# Patient Record
Sex: Male | Born: 1990 | Hispanic: No | Marital: Single | Smoking: Current every day smoker
Health system: Southern US, Community
[De-identification: ages and names within clinical notes are randomized; demographics above are authoritative.]

## PROBLEM LIST (undated history)

## (undated) DIAGNOSIS — L309 Dermatitis, unspecified: Secondary | ICD-10-CM

## (undated) HISTORY — PX: EYE SURGERY: SHX253

---

## 2011-04-14 ENCOUNTER — Emergency Department: Payer: Self-pay | Admitting: Internal Medicine

## 2013-02-23 ENCOUNTER — Emergency Department: Payer: Self-pay | Admitting: Emergency Medicine

## 2013-04-16 ENCOUNTER — Emergency Department: Payer: Self-pay | Admitting: Emergency Medicine

## 2014-01-17 ENCOUNTER — Emergency Department: Payer: Self-pay | Admitting: Emergency Medicine

## 2014-08-05 ENCOUNTER — Emergency Department: Payer: Self-pay

## 2015-05-14 ENCOUNTER — Emergency Department
Admission: EM | Admit: 2015-05-14 | Discharge: 2015-05-14 | Disposition: A | Discharge status: Home / Self Care | Payer: Self-pay | Attending: Emergency Medicine | Admitting: Emergency Medicine

## 2015-05-14 DIAGNOSIS — F17200 Nicotine dependence, unspecified, uncomplicated: Secondary | ICD-10-CM | POA: Insufficient documentation

## 2015-05-14 DIAGNOSIS — Z79899 Other long term (current) drug therapy: Secondary | ICD-10-CM | POA: Insufficient documentation

## 2015-05-14 DIAGNOSIS — L309 Dermatitis, unspecified: Secondary | ICD-10-CM | POA: Insufficient documentation

## 2015-05-14 HISTORY — DX: Dermatitis, unspecified: L30.9

## 2015-05-14 MED ORDER — CYPROHEPTADINE HCL 4 MG PO TABS: 4.0000 mg | ORAL_TABLET | Freq: Three times a day (TID) | ORAL | Status: AC | PRN

## 2015-05-14 MED ORDER — TRIAMCINOLONE ACETONIDE 0.5 % EX OINT: 1.0000 "application " | TOPICAL_OINTMENT | Freq: Two times a day (BID) | CUTANEOUS | Status: DC

## 2015-05-14 MED ORDER — PREDNISONE 10 MG PO TABS: 10.0000 mg | ORAL_TABLET | Freq: Two times a day (BID) | ORAL | Status: AC

## 2015-05-14 NOTE — ED Provider Notes (Signed)
[facility] Emergency Department Provider Note ____________________________________________  Time seen: 1600  I have reviewed the triage vital signs and the nursing notes.  HISTORY  Chief Complaint  Eczema  HPI Brandon Boyd is a 24 y.o. male reports to the ED for evaluation and treatment of his eczema which is been flaring for the past 3 months. He describes being seen by his dermatologist, but denies any significant improvement in his symptoms, since he completed his last dose of steroid ointment 3 + months earlier. He notes flare of the eczema to his hands, extremities and torso. He notes some open sores due to scratching. He has been using coconut oil and wearing gloves at work. He has also been dosing Zyrtec for daily allergy relief.  Past Medical History  Diagnosis Date  . Eczema    There are no active problems to display for this patient.  History reviewed. No pertinent past surgical history.  Current Outpatient Rx  Name  Route  Sig  Dispense  Refill  . cyproheptadine (PERIACTIN) 4 MG tablet   Oral   Take 1 tablet (4 mg total) by mouth 3 (three) times daily as needed for allergies.   30 tablet   0   . predniSONE (DELTASONE) 10 MG tablet   Oral   Take 1 tablet (10 mg total) by mouth 2 (two) times daily with a meal.   10 tablet   0   . triamcinolone ointment (KENALOG) 0.5 %   Topical   Apply 1 application topically 2 (two) times daily.   30 g   0     Mix 1:1 with Eucerin Cream    Allergies Review of patient's allergies indicates no known allergies.  No family history on file.  Social History Social History  Substance Use Topics  . Smoking status: Current Every Day Smoker  . Smokeless tobacco: None  . Alcohol Use: No   Review of Systems  Constitutional: Negative for fever. Eyes: Negative for visual changes. ENT: Negative for sore throat. Cardiovascular: Negative for chest pain. Respiratory: Negative for shortness of  breath. Gastrointestinal: Negative for abdominal pain, vomiting and diarrhea. Genitourinary: Negative for dysuria. Musculoskeletal: Negative for back pain. Skin: Positive for rash. Neurological: Negative for headaches, focal weakness or numbness. ____________________________________________  PHYSICAL EXAM:  VITAL SIGNS: ED Triage Vitals  Enc Vitals Group     BP 05/14/15 1517 145/81 mmHg     Pulse Rate 05/14/15 1517 101     Resp 05/14/15 1517 18     Temp 05/14/15 1517 98 F (36.7 C)     Temp Source 05/14/15 1517 Oral     SpO2 05/14/15 1517 98 %     Weight 05/14/15 1517 193 lb (87.544 kg)     Height 05/14/15 1517 6\' 1"  (1.854 m)     Head Cir --      Peak Flow --      Pain Score --      Pain Loc --      Pain Edu? --      Excl. in GC? --    Constitutional: Alert and oriented. Well appearing and in no distress. Head: Normocephalic and atraumatic.      Eyes: Conjunctivae are normal. PERRL. Normal extraocular movements      Ears: Canals clear. TMs intact bilaterally.   Nose: No congestion/rhinorrhea.   Mouth/Throat: Mucous membranes are moist.   Neck: Supple. No thyromegaly. Hematological/Lymphatic/Immunological: No cervical lymphadenopathy. Cardiovascular: Normal rate, regular rhythm.  Respiratory: Normal respiratory  effort. No wheezes/rales/rhonchi. Gastrointestinal: Soft and nontender. No distention. Musculoskeletal: Nontender with normal range of motion in all extremities.  Neurologic:  Normal gait without ataxia. Normal speech and language. No gross focal neurologic deficits are appreciated. Skin:  Skin is warm, dry and intact. Patient with multiple maculopapular patches across the hands, extremities, and torso consistent with severe eczema. Some of the lesions show some excoriations and crusting. Patient with hypertrophic palm lesions, nails and cuticles secondary to hand eczema. Patient's scalp with patchy scale consistent with seborrheic dermatitis. Psychiatric:  Mood and affect are normal. Patient exhibits appropriate insight and judgment. ____________________________________________  INITIAL IMPRESSION / ASSESSMENT AND PLAN / ED COURSE  Patient with a chronic history of eczema that is poorly controlled currently. 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 He is discharged with prescription for a 1:1 mixture of triamcinolone 0.5% plus Eucerin. He is also discharged with Periactin to dose daily for antihistamine benefit. He is provided with a short course of prednisone to dose as well. He will follow-up with his oncologist as scheduled next month. He is encouraged to continue to moisturize daily, and avoid excessive heat, hot showers, and other triggers. He should consider seeing an allergy specialist due to his severe allergic dermatitis. ____________________________________________  FINAL CLINICAL IMPRESSION(S) / ED DIAGNOSES  Final diagnoses:  Eczema      JeniLissa Hoard PA-C 05/14/15 1854  Emily Filbert, MD 05/15/15 [phone]

## 2015-05-14 NOTE — Discharge Instructions (Signed)
Hand Dermatitis °Hand dermatitis is a skin problem. Small, itchy, raised dots or blisters appear on the palms of the hands. Hand dermatitis can last 3 to 4 weeks. °HOME CARE °· Avoid washing your hands too much. °· Avoid all harsh chemicals. Wear gloves when you use products that can bother your skin. °· Use medicated cream (1% hydrocortisone cream) at least 2 to 4 times per day. °· Only take medicine as told by your doctor. °· You may use wet cloths (compresses) or cold packs. °GET HELP RIGHT AWAY IF:  °· The rash is not better after 1 week of treatment. °· The area is red, tender, or yellowish-white fluid (pus) comes from the wound. °· The rash is spreading. °MAKE SURE YOU:  °· Understand these instructions. °· Will watch your condition. °· Will get help right away if you are not doing well or get worse. °  °This information is not intended to replace advice given to you by your health care provider. Make sure you discuss any questions you have with your health care provider. °  °Document Released: 07/30/2009 Document Revised: 07/28/2011 Document Reviewed: 11/17/2014 °Elsevier Interactive Patient Education ©2016 Elsevier Inc[employer] ° °

## 2015-05-14 NOTE — ED Notes (Signed)
Pt c/o eczema breaking out for the past 3 months, states he has seen his dermatologist and it keeps getting worse..Brandon Boyd

## 2015-05-21 ENCOUNTER — Emergency Department
Admission: EM | Admit: 2015-05-21 | Discharge: 2015-05-21 | Disposition: A | Discharge status: Home / Self Care | Payer: Self-pay | Attending: Emergency Medicine | Admitting: Emergency Medicine

## 2015-05-21 ENCOUNTER — Encounter: Payer: Self-pay | Admitting: Emergency Medicine

## 2015-05-21 DIAGNOSIS — L309 Dermatitis, unspecified: Secondary | ICD-10-CM | POA: Insufficient documentation

## 2015-05-21 DIAGNOSIS — F17200 Nicotine dependence, unspecified, uncomplicated: Secondary | ICD-10-CM | POA: Insufficient documentation

## 2015-05-21 DIAGNOSIS — Z7952 Long term (current) use of systemic steroids: Secondary | ICD-10-CM | POA: Insufficient documentation

## 2015-05-21 MED ORDER — TRIAMCINOLONE ACETONIDE 0.5 % EX OINT: 1.0000 "application " | TOPICAL_OINTMENT | Freq: Two times a day (BID) | CUTANEOUS | Status: DC

## 2015-05-21 NOTE — ED Notes (Signed)
Pt reports needing refill of cream for his eczema

## 2015-05-21 NOTE — ED Provider Notes (Signed)
[facility] Emergency Department Provider Note ?  ? ____________________________________________ ? Time seen: 5:18 PM ? I have reviewed the triage vital signs and the nursing notes.  ________ HISTORY ? Chief Complaint Medication Refill     HPI  Brandon Boyd is a 25 y.o. male   who presents emergency department complaining of his eczema. Patient states that he was seen in this department and given a topical ointment that was combination of triamcinolone and Eucerin. He states that he has been having good relief with this medication however he states he is currently out of medication. He is seeing his dermatologist and 2 weeks and needs a refill until that time. He endorses eczema to bilateral upper and lower extremities as well as chest wall. ? ? ? Past Medical History  Diagnosis Date  . Eczema     There are no active problems to display for this patient.  ? History reviewed. No pertinent past surgical history. ? Current Outpatient Rx  Name  Route  Sig  Dispense  Refill  . cyproheptadine (PERIACTIN) 4 MG tablet   Oral   Take 1 tablet (4 mg total) by mouth 3 (three) times daily as needed for allergies.   30 tablet   0   . predniSONE (DELTASONE) 10 MG tablet   Oral   Take 1 tablet (10 mg total) by mouth 2 (two) times daily with a meal.   10 tablet   0   . triamcinolone ointment (KENALOG) 0.5 %   Topical   Apply 1 application topically 2 (two) times daily.   60 g   2     Mix 1:1 with Eucerin Cream    ? Allergies Review of patient's allergies indicates no known allergies. ? No family history on file. ? Social History Social History  Substance Use Topics  . Smoking status: Current Every Day Smoker  . Smokeless tobacco: None  . Alcohol Use: No   ? Review of Systems Constitutional: no fever. Eyes: no discharge ENT: no sore throat. Cardiovascular: no chest pain. Respiratory: no cough. No sob Gastrointestinal: denies  abdominal pain, vomiting, diarrhea, and constipation Genitourinary: no dysuria. Negative for hematuria Musculoskeletal: Negative for back pain. Skin: Positive for eczema on bilateral lower and upper extremities as well as chest wall. Neurological: Negative for headaches  10-point ROS otherwise negative.  _______________ PHYSICAL EXAM: ? VITAL SIGNS:   ED Triage Vitals  Enc Vitals Group     BP 05/21/15 1617 145/77 mmHg     Pulse Rate 05/21/15 1617 89     Resp 05/21/15 1617 16     Temp 05/21/15 1617 97.9 F (36.6 C)     Temp Source 05/21/15 1617 Oral     SpO2 05/21/15 1617 96 %     Weight 05/21/15 1617 195 lb (88.451 kg)     Height 05/21/15 1617 6\' 1"  (1.854 m)     Head Cir --      Peak Flow --      Pain Score --      Pain Loc --      Pain Edu? --      Excl. in GC? --    ?  Constitutional: Alert and oriented. Well appearing and in no distress. Eyes: Conjunctivae are normal.  ENT      Head: Normocephalic and atraumatic.      Ears:       Nose: No congestion/rhinnorhea.      Mouth/Throat: Mucous membranes are moist.  Hematological/Lymphatic/Immunilogical: No cervical lymphadenopathy. Cardiovascular: Normal rate, regular rhythm. Normal S1 and S2. Respiratory: Normal respiratory effort without tachypnea nor retractions. Lungs CTAB. Gastrointestinal: Soft and nontender. No distention. There is no CVA tenderness. Genitourinary:  Musculoskeletal: Nontender with normal range of motion in all extremities.  Neurologic:  Normal speech and language. No gross focal neurologic deficits are appreciated. Skin:  Skin is warm, dry and intact. dry flaky skin patches noted bilateral upper extremities, lower extremity's, and anterior chest wall.  Psychiatric: Mood and affect are normal. Speech and behavior are normal. Patient exhibits appropriate insight and judgment.    LABS (all labs ordered are listed, but only abnormal results are displayed)  Labs Reviewed - No data to  display  ___________ RADIOLOGY    _____________ PROCEDURES ? Procedure(s) performed:    Medications - No data to display  ______________________________________________________ INITIAL IMPRESSION / ASSESSMENT AND PLAN / ED COURSE ? Pertinent labs & imaging results that were available during my care of the patient were reviewed by me and considered in my medical decision making (see chart for details).    Patient's diagnosis is consistent with eczema. Medication of triamcinolone with Eucerin will be refilled. Patient will follow-up with his dermatologist on 06/01/2015.    New Prescriptions   No medications on file   ____________________________________________ FINAL CLINICAL IMPRESSION(S) / ED DIAGNOSES?  Final diagnoses:  Eczema            Racheal PatchesJonathan D Cuthriell, PA-C 05/21/15 1718  Jennye MoccasinBrian S Quigley, MD 05/21/15 1725

## 2015-05-21 NOTE — ED Notes (Signed)
Pt reports he needs refill on eczema cream, appt with dermatologist on 1/17.

## 2015-05-21 NOTE — Discharge Instructions (Signed)
Eczema Eczema, also called atopic dermatitis, is a skin disorder that causes inflammation of the skin. It causes a red rash and dry, scaly skin. The skin becomes very itchy. Eczema is generally worse during the cooler winter months and often improves with the warmth of summer. Eczema usually starts showing signs in infancy. Some children outgrow eczema, but it may last through adulthood.  CAUSES  The exact cause of eczema is not known, but it appears to run in families. People with eczema often have a family history of eczema, allergies, asthma, or hay fever. Eczema is not contagious. Flare-ups of the condition may be caused by:   Contact with something you are sensitive or allergic to.   Stress. SIGNS AND SYMPTOMS  Dry, scaly skin.   Red, itchy rash.   Itchiness. This may occur before the skin rash and may be very intense.  DIAGNOSIS  The diagnosis of eczema is usually made based on symptoms and medical history. TREATMENT  Eczema cannot be cured, but symptoms usually can be controlled with treatment and other strategies. A treatment plan might include:  Controlling the itching and scratching.   Use over-the-counter antihistamines as directed for itching. This is especially useful at night when the itching tends to be worse.   Use over-the-counter steroid creams as directed for itching.   Avoid scratching. Scratching makes the rash and itching worse. It may also result in a skin infection (impetigo) due to a break in the skin caused by scratching.   Keeping the skin well moisturized with creams every day. This will seal in moisture and help prevent dryness. Lotions that contain alcohol and water should be avoided because they can dry the skin.   Limiting exposure to things that you are sensitive or allergic to (allergens).   Recognizing situations that cause stress.   Developing a plan to manage stress.  HOME CARE INSTRUCTIONS   Only take over-the-counter or  prescription medicines as directed by your health care provider.   Do not use anything on the skin without checking with your health care provider.   Keep baths or showers short (5 minutes) in warm (not hot) water. Use mild cleansers for bathing. These should be unscented. You may add nonperfumed bath oil to the bath water. It is best to avoid soap and bubble bath.   Immediately after a bath or shower, when the skin is still damp, apply a moisturizing ointment to the entire body. This ointment should be a petroleum ointment. This will seal in moisture and help prevent dryness. The thicker the ointment, the better. These should be unscented.   Keep fingernails cut short. Children with eczema may need to wear soft gloves or mittens at night after applying an ointment.   Dress in clothes made of cotton or cotton blends. Dress lightly, because heat increases itching.   A child with eczema should stay away from anyone with fever blisters or cold sores. The virus that causes fever blisters (herpes simplex) can cause a serious skin infection in children with eczema. SEEK MEDICAL CARE IF:   Your itching interferes with sleep.   Your rash gets worse or is not better within 1 week after starting treatment.   You see pus or soft yellow scabs in the rash area.   You have a fever.   You have a rash flare-up after contact with someone who has fever blisters.    This information is not intended to replace advice given to you by your health care   provider. Make sure you discuss any questions you have with your health care provider.   Document Released: 05/02/2000 Document Revised: 02/23/2013 Document Reviewed: 12/06/2012 Elsevier Interactive Patient Education 2016 Elsevier Inc.  

## 2015-06-14 ENCOUNTER — Encounter: Payer: Self-pay | Admitting: Emergency Medicine

## 2015-06-14 ENCOUNTER — Emergency Department
Admission: EM | Admit: 2015-06-14 | Discharge: 2015-06-14 | Disposition: A | Discharge status: Home / Self Care | Payer: Self-pay | Attending: Emergency Medicine | Admitting: Emergency Medicine

## 2015-06-14 DIAGNOSIS — F17200 Nicotine dependence, unspecified, uncomplicated: Secondary | ICD-10-CM | POA: Insufficient documentation

## 2015-06-14 DIAGNOSIS — E876 Hypokalemia: Secondary | ICD-10-CM

## 2015-06-14 DIAGNOSIS — Z7952 Long term (current) use of systemic steroids: Secondary | ICD-10-CM | POA: Insufficient documentation

## 2015-06-14 DIAGNOSIS — Z72820 Sleep deprivation: Secondary | ICD-10-CM

## 2015-06-14 DIAGNOSIS — F1210 Cannabis abuse, uncomplicated: Secondary | ICD-10-CM | POA: Insufficient documentation

## 2015-06-14 DIAGNOSIS — F1510 Other stimulant abuse, uncomplicated: Secondary | ICD-10-CM | POA: Insufficient documentation

## 2015-06-14 DIAGNOSIS — R000 Tachycardia, unspecified: Secondary | ICD-10-CM

## 2015-06-14 DIAGNOSIS — F1910 Other psychoactive substance abuse, uncomplicated: Secondary | ICD-10-CM

## 2015-06-14 DIAGNOSIS — R002 Palpitations: Secondary | ICD-10-CM | POA: Insufficient documentation

## 2015-06-14 DIAGNOSIS — F1610 Hallucinogen abuse, uncomplicated: Secondary | ICD-10-CM

## 2015-06-14 LAB — CBC
HEMATOCRIT: 51.2 % (ref 40.0–52.0)
HEMOGLOBIN: 17.1 g/dL (ref 13.0–18.0)
MCH: 31.6 pg (ref 26.0–34.0)
MCHC: 33.4 g/dL (ref 32.0–36.0)
MCV: 94.6 fL (ref 80.0–100.0)
Platelets: 197 10*3/uL (ref 150–440)
RBC: 5.41 MIL/uL (ref 4.40–5.90)
RDW: 13 % (ref 11.5–14.5)
WBC: 11 10*3/uL — ABNORMAL HIGH (ref 3.8–10.6)

## 2015-06-14 LAB — COMPREHENSIVE METABOLIC PANEL
ALBUMIN: 5.1 g/dL — AB (ref 3.5–5.0)
ALK PHOS: 99 U/L (ref 38–126)
ALT: 32 U/L (ref 17–63)
AST: 31 U/L (ref 15–41)
Anion gap: 8 (ref 5–15)
BILIRUBIN TOTAL: 1 mg/dL (ref 0.3–1.2)
BUN: 11 mg/dL (ref 6–20)
CO2: 28 mmol/L (ref 22–32)
Calcium: 9.8 mg/dL (ref 8.9–10.3)
Chloride: 99 mmol/L — ABNORMAL LOW (ref 101–111)
Creatinine, Ser: 0.87 mg/dL (ref 0.61–1.24)
GFR calc Af Amer: >60 mL/min (ref >60)
GFR calc non Af Amer: >60 mL/min (ref >60)
GLUCOSE: 127 mg/dL — AB (ref 65–99)
POTASSIUM: 3 mmol/L — AB (ref 3.5–5.1)
SODIUM: 135 mmol/L (ref 135–145)
TOTAL PROTEIN: 9.1 g/dL — AB (ref 6.5–8.1)

## 2015-06-14 LAB — URINE DRUG SCREEN, QUALITATIVE (ARMC ONLY)
AMPHETAMINES, UR SCREEN: NOT DETECTED
BENZODIAZEPINE, UR SCRN: NOT DETECTED
Barbiturates, Ur Screen: NOT DETECTED
COCAINE METABOLITE, UR ~~LOC~~: NOT DETECTED
Cannabinoid 50 Ng, Ur ~~LOC~~: POSITIVE — AB
MDMA (Ecstasy)Ur Screen: NOT DETECTED
Methadone Scn, Ur: NOT DETECTED
OPIATE, UR SCREEN: NOT DETECTED
PHENCYCLIDINE (PCP) UR S: NOT DETECTED
Tricyclic, Ur Screen: NOT DETECTED

## 2015-06-14 LAB — ETHANOL: Alcohol, Ethyl (B): <5 mg/dL (ref <5)

## 2015-06-14 MED ORDER — POTASSIUM CHLORIDE CRYS ER 20 MEQ PO TBCR
20.0000 meq | EXTENDED_RELEASE_TABLET | Freq: Once | ORAL | Status: AC
  Administered 2015-06-14: 20 meq via ORAL
  Filled 2015-06-14: qty 1

## 2015-06-14 MED ORDER — SODIUM CHLORIDE 0.9 % IV BOLUS (SEPSIS)
1000.0000 mL | Freq: Once | INTRAVENOUS | Status: AC
  Administered 2015-06-14: 1000 mL via INTRAVENOUS

## 2015-06-14 NOTE — ED Notes (Signed)
Phone numbers--girlfriend Janelle--[phone] Dad--[phone]

## 2015-06-14 NOTE — ED Notes (Signed)
Reports taking an aderall that was not his, feel "fucked up"

## 2015-06-14 NOTE — ED Provider Notes (Signed)
[facility] Emergency Department Provider Note  ____________________________________________  Time seen: 1515  I have reviewed the triage vital signs and the nursing notes.  History by:  Patient  HISTORY  Chief Complaint Altered Mental Status  palpitations    HPI Brandon Boyd is a 25 y.o. male who reports he doesn't feel well and he feels "fucked up".  Patient told the triage nurse that he had taking a pill given to him by a friend that was identified as it are all. Upon my examination, the patient reports this was a falsely and he is forthright with me reporting that he took "Molly" (mdma) at 3 AM after on night of a moderate amount of alcohol. He remained awake throughout the night and today following this ingestion.  He reports he has some palpitations and feels kind of funny. He is currently communicative with insight. He denies any nausea or vomiting or belly pain. He denies headache or any focal weakness.    Past Medical History  Diagnosis Date  . Eczema     There are no active problems to display for this patient.   History reviewed. No pertinent past surgical history.  Current Outpatient Rx  Name  Route  Sig  Dispense  Refill  . cyproheptadine (PERIACTIN) 4 MG tablet   Oral   Take 1 tablet (4 mg total) by mouth 3 (three) times daily as needed for allergies.   30 tablet   0   . predniSONE (DELTASONE) 10 MG tablet   Oral   Take 1 tablet (10 mg total) by mouth 2 (two) times daily with a meal.   10 tablet   0   . triamcinolone ointment (KENALOG) 0.5 %   Topical   Apply 1 application topically 2 (two) times daily.   60 g   2     Mix 1:1 with Eucerin Cream     Allergies Review of patient's allergies indicates no known allergies.  History reviewed. No pertinent family history.  Social History Social History  Substance Use Topics  . Smoking status: Current Every Day Smoker  . Smokeless tobacco: None  . Alcohol Use: No     Review of Systems  Constitutional: Negative for fever/chills. ENT: Negative for congestion. Cardiovascular: Positive for palpitations.Marland Kitchen Respiratory: Negative for cough. Gastrointestinal: Negative for abdominal pain, vomiting and diarrhea. Genitourinary: Negative for dysuria. Musculoskeletal: No back pain. Skin: Negative for rash. Neurological: Negative for headache or focal weakness. Positive for ingestion of a psychoactive drugs last night.   10-point ROS otherwise negative.  ____________________________________________   PHYSICAL EXAM:  VITAL SIGNS: ED Triage Vitals  Enc Vitals Group     BP 06/14/15 1349 150/132 mmHg     Pulse Rate 06/14/15 1349 116     Resp --      Temp 06/14/15 1349 98.9 F (37.2 C)     Temp Source 06/14/15 1349 Oral     SpO2 06/14/15 1349 100 %     Weight 06/14/15 1349 195 lb (88.451 kg)     Height 06/14/15 1349  (1.854 m)     Head Cir --      Peak Flow --      Pain Score 06/14/15 1352 1     Pain Loc --      Pain Edu? --      Excl. in GC? --     Constitutional: Alert and oriented. Well-developed, no distress. ENT   Head: Normocephalic and atraumatic.   Nose: No  congestion/rhinnorhea.       Mouth: No erythema, no swelling   Cardiovascular: Normal rate at 96, regular rhythm, no murmur noted Respiratory:  Normal respiratory effort, no tachypnea.    Breath sounds are clear and equal bilaterally.  Gastrointestinal: Soft, no distention. Nontender Back: No muscle spasm, no tenderness, no CVA tenderness. Musculoskeletal: No deformity noted. Nontender with normal range of motion in all extremities.  No noted edema. Neurologic:  Communicative. Normal appearing spontaneous movement in all 4 extremities. No gross focal neurologic deficits are appreciated.  Skin:  Skin is warm, dry. No rash noted. Psychiatric: Communicative. Cognitive. Reasonable insight at this stage. Expresses regret for having taken this drug for the first time. Denies  any hallucinations. Denies suicidal or homicidal ideation.  ____________________________________________    LABS (pertinent positives/negatives)  Labs Reviewed  COMPREHENSIVE METABOLIC PANEL - Abnormal; Notable for the following:    Potassium 3.0 (*)    Chloride 99 (*)    Glucose, Bld 127 (*)    Total Protein 9.1 (*)    Albumin 5.1 (*)    All other components within normal limits  URINE DRUG SCREEN, QUALITATIVE ([facility] ONLY) - Abnormal; Notable for the following:    Cannabinoid 50 Ng, Ur [place] POSITIVE (*)    All other components within normal limits  CBC - Abnormal; Notable for the following:    WBC 11.0 (*)    All other components within normal limits  ETHANOL     ____________________________________________   EKG ED ECG REPORT I, Ahnna Dungan W, the attending physician, personally viewed and interpreted this ECG.   Date: 06/14/2015  EKG Time: 1350  Rate: 123  Rhythm: Sinus tachycardia with incomplete right bundle branch block  Axis: Normal  Intervals: Normal  ST&T Change: None noted   ____________________________________________   ____________________________________________   INITIAL IMPRESSION / ASSESSMENT AND PLAN / ED COURSE  Pertinent labs & imaging results that were available during my care of the patient were reviewed by me and considered in my medical decision making (see chart for details).  Overall well-appearing 25 year old male in no acute distress following no sleep over the past 30 hours and alcohol last night followed by ingestion of "Molly".  The patient has mild tachycardia with a heart rate from 96 to the documented 123 on his EKG. Currently, the heart rate is reasonable, less than 100, and he is in no distress. He is ambulatory. We will treat him with 1 L of normal saline and began oral solutions as well. If he looks well after that, we will consider discharge home.  ----------------------------------------- 4:43 PM on  06/14/2015 -----------------------------------------  Patient overall looks well. His heart rate is 86 on reexamination. His potassium was noted to be 3.0 and I have ordered 20 mEq of potassium by mouth. He is tolerating oral fluids. He's had out the juice and water. He is alert and communicative.  We will discharge him at this time.  ____________________________________________   FINAL CLINICAL IMPRESSION(S) / ED DIAGNOSES  Final diagnoses:  MDMA abuse  Tachycardia  Polysubstance abuse  Sleep deprivation  Hypokalemia      Darien Ramus, MD 06/14/15 1644

## 2015-06-14 NOTE — Discharge Instructions (Signed)
The drug he took, "[employer]", is an illegal substance with an amphetamine component. Do not take this again. Part of your ill feelings today was also likely due to sleep deprivation. Rest tonight.  Return the emergency department if you have further palpitations or other ill feelings or you have other urgent concerns.  Amphetamines Amphetamines are one of a group of powerful drugs known as stimulants. Amphetamines have a number of medical uses, including the treatment of a daytime sleepiness disorder due to narcolepsy or sleep apnea, attention deficit hyperactivity disorder, and chronic fatigue syndrome. However, amphetamines also are often misused because of the effects they produce. These effects include:  A feeling of extreme pleasure (euphoria).  Alertness.  Increased attention.  High energy.  Loss of appetite for weight loss. Common street names for these drugs include speed and crank. Amphetamines are taken by mouth, crushed and snorted, or dissolved in water and injected. Stimulants are addictive because they activate regions of the brain that are responsible for producing both the pleasurable sensation of "reward" and psychological dependence. Together, these actions account for loss of control and the rapid development of drug dependence. This means you will become ill without the drug (withdrawal) and need to keep using it to function.  Stimulant use disorder is use of stimulants that disrupts your daily life. It disrupts relationships with family and friends and how you do your job. Amphetamines increase blood pressure and heart rate. Use can lead to heart attack or stroke. Use can also cause death from irregular heart rate, seizures, or dangerously high body temperature. SIGNS AND SYMPTOMS  Symptoms of stimulant use disorder with amphetamines include:  Use of amphetamines in larger amounts or over a longer period than intended.  Unsuccessful attempts to cut down or control amphetamine  use.  A lot of time spent obtaining, using, or recovering from the effects of amphetamines.  A strong desire or urge to use amphetamines (craving).  Continued use of amphetamines in spite of major problems at work, school, or home because of use.  Continued use of amphetamines in spite of relationship problems because of use.  Giving up or cutting down on important life activities because of amphetamine use.  Use of amphetamines over and over in situations when it is physically hazardous, such as driving a car.  Continued use of amphetamines in spite of a physical problem that is likely related to amphetamine use. Physical problems can include:  Unintended weight loss.  High blood pressure.  Chest pain.  Infections such as human immunodeficiency virus and hepatitis (from injecting amphetamines).  Continued use of amphetamines in spite of mental problems that are likely related to use. Mental problems can include:  Anxiety.  Sleep problems.  Schizophrenia-like symptoms.  Depression.  Bipolar mood swings.  Violent behavior.  Need to use more and more amphetamines to get the same effect, or lessened effect over time with use of the same amount (tolerance).  Having withdrawal symptoms when amphetamine use is stopped, or using amphetamines to reduce or avoid withdrawal symptoms. Withdrawal symptoms include:  Depressed mood.  Low energy or restlessness.  Bad dreams.  Too little or too much sleep.  Increased appetite. DIAGNOSIS  Stimulant use disorder is diagnosed by your health care provider. You may be asked questions about your amphetamine use and how it affects your life. A physical exam may be done. A drug screen may be ordered. You may be referred to a mental health professional. The diagnosis of stimulant use disorder requires  two or more symptoms within 12 months. The type of stimulant use disorder you have depends on the number of signs and symptoms you have. The  type may be:  Mild. Two or three signs and symptoms.  Moderate. Four or five signs and symptoms.  Severe. Six or more signs and symptoms. TREATMENT  The treatment for most problems related to stimulant use disorder with amphetamines may be divided into two types:  Short-term medical treatment. This helps to preserve life and prevent or minimize damage from physical or mental problems related to use.  Long-term substance abuse treatment. This focuses on recovery from use disorder. It is provided by mental health professionals who have training in substance use disorders. It is usually a combination of counseling, support groups, and nonaddictive medicines that can reduce cravings or block the effects of amphetamines. HOME CARE INSTRUCTIONS   Take medicines only as directed by your health care provider.  Identify the people and activities that trigger your amphetamine use and avoid them.  Keep all follow-up visits as directed by your health care provider. SEEK MEDICAL CARE IF:  Your symptoms get worse or you relapse.  You are not able to take medicines as directed. SEEK IMMEDIATE MEDICAL CARE IF:   You have serious thoughts about hurting yourself or others.  You have a seizure, chest pain, sudden weakness, or loss of speech or vision. FOR MORE INFORMATION  National Institute on Drug Abuse: http://www.price-smith.com/  Substance Abuse and Mental Health Services Administration: SkateOasis.com.pt   This information is not intended to replace advice given to you by your health care provider. Make sure you discuss any questions you have with your health care provider.   Document Released: 04/29/2001 Document Revised: 05/26/2014 Document Reviewed: 05/18/2013 Elsevier Interactive Patient Education [employer]

## 2015-12-29 DIAGNOSIS — F17200 Nicotine dependence, unspecified, uncomplicated: Secondary | ICD-10-CM | POA: Insufficient documentation

## 2015-12-29 DIAGNOSIS — J340 Abscess, furuncle and carbuncle of nose: Secondary | ICD-10-CM | POA: Diagnosis not present

## 2015-12-29 DIAGNOSIS — R238 Other skin changes: Secondary | ICD-10-CM | POA: Diagnosis present

## 2015-12-30 ENCOUNTER — Emergency Department
Admission: EM | Admit: 2015-12-30 | Discharge: 2015-12-30 | Disposition: A | Discharge status: Home / Self Care | Payer: BLUE CROSS/BLUE SHIELD | Attending: Emergency Medicine | Admitting: Emergency Medicine

## 2015-12-30 DIAGNOSIS — J340 Abscess, furuncle and carbuncle of nose: Secondary | ICD-10-CM

## 2015-12-30 MED ORDER — PENTAFLUOROPROP-TETRAFLUOROETH EX AERO
INHALATION_SPRAY | Freq: Once | CUTANEOUS | Status: AC
  Administered 2015-12-30: 02:00:00 via TOPICAL

## 2015-12-30 MED ORDER — CLINDAMYCIN HCL 150 MG PO CAPS
300.0000 mg | ORAL_CAPSULE | Freq: Once | ORAL | Status: AC
  Administered 2015-12-30: 300 mg via ORAL
  Filled 2015-12-30: qty 2

## 2015-12-30 MED ORDER — CLINDAMYCIN HCL 150 MG PO CAPS: 150.0000 mg | ORAL_CAPSULE | Freq: Three times a day (TID) | ORAL | 0 refills | Status: AC

## 2015-12-30 MED ORDER — MUPIROCIN CALCIUM 2 % NA OINT: 1.0000 "application " | TOPICAL_OINTMENT | Freq: Two times a day (BID) | NASAL | 0 refills | Status: AC

## 2015-12-30 NOTE — ED Triage Notes (Signed)
Patient reports on inside of upper lip has a "bump" and he "popped" it and some stuff came out.  Reports "bump" has returned with swelling and pain.

## 2015-12-30 NOTE — ED Notes (Signed)
Reviewed d/c instructions, follow-up care and prescriptions with pt. Pt verbalized understanding 

## 2015-12-30 NOTE — ED Notes (Signed)
MD McShane at bedside  

## 2015-12-30 NOTE — ED Notes (Signed)
Pt c/o of swollen area to upper lip. Pt reports he popped a pimple just below right nostril on Thursday evening. Pt reports when he awoke Friday morning he started to have some inflammation/swelling to upper lip. Pt reports increasing severity since. Pt has redness/inflammation to upper lip.

## 2015-12-30 NOTE — ED Provider Notes (Signed)
[facility] Emergency Department Provider Note  ____________________________________________   I have reviewed the triage vital signs and the nursing notes.   HISTORY  Chief Complaint Abscess    HPI Brandon Boyd is a 25 y.o. male presents today with swelling in his left nares. Patient had a "pimple" in his leftnostril which he tried to pop yesterday. Then there is some swelling afterwards. No fever no systemic illness. No other complaints.     Past Medical History:  Diagnosis Date  . Eczema     There are no active problems to display for this patient.   No past surgical history on file.  Current Outpatient Rx  . Order #: [id number] Class: Print  . Order #: [id number] Class: Print  . Order #: [id number] Class: Print    Allergies Review of patient's allergies indicates no known allergies.  No family history on file.  Social History Social History  Substance Use Topics  . Smoking status: Current Every Day Smoker  . Smokeless tobacco: Not on file  . Alcohol use No    Review of Systems Constitutional: No fever/chills Eyes: No visual changes. ENT: No sore throat. No stiff neck no neck pain Cardiovascular: Denies chest pain. Respiratory: Denies shortness of breath. Gastrointestinal:   no vomiting.  No diarrhea.  No constipation. Genitourinary: Negative for dysuria. Musculoskeletal: Negative lower extremity swelling Skin: Negative for rash. Neurological: Negative for severe headaches, focal weakness or numbness. 10-point ROS otherwise negative.  ____________________________________________   PHYSICAL EXAM:  VITAL SIGNS: ED Triage Vitals  Enc Vitals Group     BP 12/30/15 0017 (!) 133/94     Pulse Rate 12/30/15 0017 (!) 102     Resp 12/30/15 0017 18     Temp 12/30/15 0017 98.2 F (36.8 C)     Temp Source 12/30/15 0017 Oral     SpO2 12/30/15 0017 100 %     Weight 12/30/15 0015 195 lb (88.5 kg)     Height 12/30/15 0015 6\' 1"   (1.854 m)     Head Circumference --      Peak Flow --      Pain Score 12/30/15 0015 5     Pain Loc --      Pain Edu? --      Excl. in GC? --     Constitutional: Alert and oriented. Well appearing and in no acute distress. Eyes: Conjunctivae are normal. PERRL. EOMI. Head: Atraumatic. Nose: No congestion/rhinnorhea. There is a small area that looks like is a resolving, done in the left nares just at the opening and there is some swelling, partially 1 cm underneath that which is not fluctuant but is indurated and mildly uncomfortable Mouth/Throat: Mucous membranes are moist.  Oropharynx non-erythematous. Neck: No stridor.   Nontender with no meningismus.  Skin:  Skin is warm, dry and intact. No rash noted. Psychiatric: Mood and affect are normal. Speech and behavior are normal.  ____________________________________________   LABS (all labs ordered are listed, but only abnormal results are displayed)  Labs Reviewed  AEROBIC CULTURE (SUPERFICIAL SPECIMEN)   ____________________________________________  EKG  I personally interpreted any EKGs ordered by me or triage  ____________________________________________  RADIOLOGY  I reviewed any imaging ordered by me or triage that were performed during my shift and, if possible, patient and/or family made aware of any abnormal findings. ____________________________________________   PROCEDURES  Procedure(s) performed: With patient consent I sterilized the area with chlorhexidine, and then used a small amount of pain ease to numb the  area use an 18-gauge needle to see if I could aspirate purulence. There was none. There is minimal bleeding controlled with pressure. No other palpitation.  Procedures  Critical Care performed: None  ____________________________________________   INITIAL IMPRESSION / ASSESSMENT AND PLAN / ED COURSE  Pertinent labs & imaging results that were available during my care of the patient were reviewed by  me and considered in my medical decision making (see chart for details).  Patient with a small localized infection in the nose. No evidence of abscess. It was not fluctuant but it was indurated and I did try to see if we could aspirate purulence but there is none to be aspirated. Patient tolerated the procedure well with no complications. We will set the patient clindamycin for this small area to ensure that it does not become worse, we will have him follow closely with outpatient physician. Return precautions given and understood.  Clinical Course   ____________________________________________   FINAL CLINICAL IMPRESSION(S) / ED DIAGNOSES  Final diagnoses:  None      This chart was dictated using voice recognition software.  Despite best efforts to proofread,  errors can occur which can change meaning.      Jeanmarie Plant, MD 12/30/15 [phone]

## 2015-12-30 NOTE — ED Notes (Signed)
Pt c/o stye to left eye

## 2016-10-03 ENCOUNTER — Encounter: Payer: Self-pay | Admitting: *Deleted

## 2016-10-03 ENCOUNTER — Emergency Department: Payer: Self-pay

## 2016-10-03 ENCOUNTER — Emergency Department
Admission: EM | Admit: 2016-10-03 | Discharge: 2016-10-03 | Disposition: A | Discharge status: Home / Self Care | Payer: Self-pay | Attending: Emergency Medicine | Admitting: Emergency Medicine

## 2016-10-03 DIAGNOSIS — Y999 Unspecified external cause status: Secondary | ICD-10-CM | POA: Insufficient documentation

## 2016-10-03 DIAGNOSIS — S93601A Unspecified sprain of right foot, initial encounter: Secondary | ICD-10-CM | POA: Insufficient documentation

## 2016-10-03 DIAGNOSIS — Y92009 Unspecified place in unspecified non-institutional (private) residence as the place of occurrence of the external cause: Secondary | ICD-10-CM | POA: Insufficient documentation

## 2016-10-03 DIAGNOSIS — Z79899 Other long term (current) drug therapy: Secondary | ICD-10-CM | POA: Insufficient documentation

## 2016-10-03 DIAGNOSIS — Y9301 Activity, walking, marching and hiking: Secondary | ICD-10-CM | POA: Insufficient documentation

## 2016-10-03 DIAGNOSIS — F17200 Nicotine dependence, unspecified, uncomplicated: Secondary | ICD-10-CM | POA: Insufficient documentation

## 2016-10-03 DIAGNOSIS — S93401A Sprain of unspecified ligament of right ankle, initial encounter: Secondary | ICD-10-CM | POA: Insufficient documentation

## 2016-10-03 DIAGNOSIS — W010XXA Fall on same level from slipping, tripping and stumbling without subsequent striking against object, initial encounter: Secondary | ICD-10-CM | POA: Insufficient documentation

## 2016-10-03 MED ORDER — NABUMETONE 750 MG PO TABS: 750.0000 mg | ORAL_TABLET | Freq: Two times a day (BID) | ORAL | 0 refills | Status: AC

## 2016-10-03 NOTE — ED Notes (Signed)

## 2016-10-03 NOTE — ED Provider Notes (Signed)
[facility] Emergency Department Provider Note ____________________________________________  Time seen: 2033  I have reviewed the triage vital signs and the nursing notes.  HISTORY  Chief Complaint  Ankle Pain  HPI Brandon Boyd is a 26 y.o. male presents to the ED for evaluation of right foot and ankle pain after a mechanical fall 1 day prior. The patient describes walking down a wet embankment at the house, and his athletic flops. He describes rolling his right foot and ankle and experiencing pain laterally. He's had some disability with ambulation and notes pain and swelling to the lateral ankle. He also notes some discomfort to the posterior calf with walking. He denies any other injury at this time. He applied an Ace bandage at home and took ibuprofen yesterday at the time of the injury.  Past Medical History:  Diagnosis Date  . Eczema     There are no active problems to display for this patient.  History reviewed. No pertinent surgical history.  Prior to Admission medications   Medication Sig Start Date End Date Taking? Authorizing Provider  cyproheptadine (PERIACTIN) 4 MG tablet Take 1 tablet (4 mg total) by mouth 3 (three) times daily as needed for allergies. 05/14/15   Montey Ebel, Charlesetta Ivory, PA-C  mupirocin nasal ointment (BACTROBAN NASAL) 2 % Place 1 application into the nose 2 (two) times daily. 12/30/15   Jeanmarie Plant, MD  nabumetone (RELAFEN) 750 MG tablet Take 1 tablet (750 mg total) by mouth 2 (two) times daily. 10/03/16   Shaquasha Gerstel, Charlesetta Ivory, PA-C  predniSONE (DELTASONE) 10 MG tablet Take 1 tablet (10 mg total) by mouth 2 (two) times daily with a meal. 05/14/15   Torence Palmeri, Charlesetta Ivory, PA-C  triamcinolone ointment (KENALOG) 0.5 % Apply 1 application topically 2 (two) times daily. 05/21/15   Cuthriell, Delorise Royals, PA-C   Allergies Patient has no known allergies.  No family history on file.  Social History Social History   Substance Use Topics  . Smoking status: Current Every Day Smoker  . Smokeless tobacco: Not on file  . Alcohol use No   Review of Systems  Constitutional: Negative for fever. Cardiovascular: Negative for chest pain. Respiratory: Negative for shortness of breath. Musculoskeletal: Negative for back pain. Right ankle and foot pain as above.  Skin: Negative for rash. Neurological: Negative for headaches, focal weakness or numbness. ____________________________________________  PHYSICAL EXAM:  VITAL SIGNS: ED Triage Vitals  Enc Vitals Group     BP 10/03/16 1944 133/80     Pulse Rate 10/03/16 1944 (!) 102     Resp 10/03/16 1944 20     Temp 10/03/16 1944 98 F (36.7 C)     Temp Source 10/03/16 1944 Oral     SpO2 10/03/16 1944 99 %     Weight 10/03/16 1943 195 lb (88.5 kg)     Height 10/03/16 1943 6\' 1"  (1.854 m)     Head Circumference --      Peak Flow --      Pain Score 10/03/16 1943 8     Pain Loc --      Pain Edu? --      Excl. in GC? --    Constitutional: Alert and oriented. Well appearing and in no distress. Head: Normocephalic and atraumatic. Cardiovascular: Normal rate, regular rhythm. Normal distal pulses. Respiratory: Normal respiratory effort. No wheezes/rales/rhonchi. Musculoskeletal: Right foot and ankle without obvious deformity, dislocation, or effusion. Patient with some subtle swelling to the lateral malleolus. He  has normal ankle range of motion and negative anterior drawer. No Achilles tenderness is appreciated. He has some mild proximal calf tenderness but is noted to have a negative Thompson test. Nontender with normal range of motion in all other extremities.  Neurologic:  Antalgic gait without ataxia.  Skin:  Skin is warm, dry and intact. No rash noted. ____________________________________________   RADIOLOGY  Right Ankle/Right Foot  IMPRESSION: 1. Soft tissue swelling about the lateral malleolus and dorsum of the midfoot. 2. Tiny ossific density is  noted over the dorsum of the midfoot at the level of the cuneiform. A tiny joint capsule avulsion is not excluded. 3. Plantar calcaneal enthesophyte. ____________________________________________  PROCEDURES  Ace bandage Velcro Stirrup splint ____________________________________________  INITIAL IMPRESSION / ASSESSMENT AND PLAN / ED COURSE  Patient with a right ankle and foot sprain without radiologic evidence of fracture dislocation. He is discharged with an Ace bandage and Velcro stirrup splint to ambulate. He has a pair crutches he will use for weightbearing as tolerated. He will be referred to podiatry for ongoing symptom management.Prescription for Relafen is provided a work note for tomorrow is given as well. ____________________________________________  FINAL CLINICAL IMPRESSION(S) / ED DIAGNOSES  Final diagnoses:  Sprain of right ankle, unspecified ligament, initial encounter  Sprain of right foot, initial encounter      Lissa HoardMenshew, Tareq Dwan V Bacon, PA-C 10/03/16 2138    Jeanmarie PlantMcShane, James A, MD 10/03/16 2316

## 2016-10-03 NOTE — ED Triage Notes (Signed)
Pt slipped in the rain last night, denies hitting head, injured right ankle, pt is unable to bear weight on ankle, ankle is swollen

## 2016-10-03 NOTE — Discharge Instructions (Signed)
Your x-rays are normal at this time. You are being treated for a moderate ankle and foot sprain. Wear the ace bandage and stirrup splint as needed for support. Use your crutches as needed for walking. Follow-up with podiatry for any continued symptoms. Take the Relafen as directed for pain and inflammation.

## 2017-05-15 ENCOUNTER — Emergency Department
Admission: EM | Admit: 2017-05-15 | Discharge: 2017-05-15 | Disposition: A | Discharge status: Home / Self Care | Payer: Self-pay | Attending: Emergency Medicine | Admitting: Emergency Medicine

## 2017-05-15 ENCOUNTER — Encounter: Payer: Self-pay | Admitting: Emergency Medicine

## 2017-05-15 ENCOUNTER — Emergency Department: Payer: Self-pay

## 2017-05-15 DIAGNOSIS — Z79899 Other long term (current) drug therapy: Secondary | ICD-10-CM | POA: Insufficient documentation

## 2017-05-15 DIAGNOSIS — H6690 Otitis media, unspecified, unspecified ear: Secondary | ICD-10-CM

## 2017-05-15 DIAGNOSIS — H6692 Otitis media, unspecified, left ear: Secondary | ICD-10-CM | POA: Insufficient documentation

## 2017-05-15 DIAGNOSIS — M948X9 Other specified disorders of cartilage, unspecified sites: Secondary | ICD-10-CM | POA: Insufficient documentation

## 2017-05-15 DIAGNOSIS — F17200 Nicotine dependence, unspecified, uncomplicated: Secondary | ICD-10-CM | POA: Insufficient documentation

## 2017-05-15 DIAGNOSIS — L309 Dermatitis, unspecified: Secondary | ICD-10-CM | POA: Insufficient documentation

## 2017-05-15 LAB — COMPREHENSIVE METABOLIC PANEL
ALBUMIN: 4.4 g/dL (ref 3.5–5.0)
ALK PHOS: 90 U/L (ref 38–126)
ALT: 47 U/L (ref 17–63)
ANION GAP: 10 (ref 5–15)
AST: 33 U/L (ref 15–41)
BILIRUBIN TOTAL: 0.9 mg/dL (ref 0.3–1.2)
BUN: 12 mg/dL (ref 6–20)
CALCIUM: 9.2 mg/dL (ref 8.9–10.3)
CO2: 24 mmol/L (ref 22–32)
Chloride: 102 mmol/L (ref 101–111)
Creatinine, Ser: 0.95 mg/dL (ref 0.61–1.24)
GFR calc Af Amer: >60 mL/min (ref >60)
GLUCOSE: 114 mg/dL — AB (ref 65–99)
Potassium: 4 mmol/L (ref 3.5–5.1)
Sodium: 136 mmol/L (ref 135–145)
Total Protein: 7.9 g/dL (ref 6.5–8.1)

## 2017-05-15 LAB — CBC
HEMATOCRIT: 50.7 % (ref 40.0–52.0)
HEMOGLOBIN: 17 g/dL (ref 13.0–18.0)
MCH: 33.5 pg (ref 26.0–34.0)
MCHC: 33.5 g/dL (ref 32.0–36.0)
MCV: 99.9 fL (ref 80.0–100.0)
Platelets: 163 10*3/uL (ref 150–440)
RBC: 5.08 MIL/uL (ref 4.40–5.90)
RDW: 13.7 % (ref 11.5–14.5)
WBC: 8.3 10*3/uL (ref 3.8–10.6)

## 2017-05-15 MED ORDER — LEVOFLOXACIN IN D5W 500 MG/100ML IV SOLN
500.0000 mg | Freq: Once | INTRAVENOUS | Status: AC
  Administered 2017-05-15: 500 mg via INTRAVENOUS
  Filled 2017-05-15: qty 100

## 2017-05-15 MED ORDER — IOPAMIDOL (ISOVUE-300) INJECTION 61%
75.0000 mL | Freq: Once | INTRAVENOUS | Status: AC | PRN
  Administered 2017-05-15: 75 mL via INTRAVENOUS
  Filled 2017-05-15: qty 75

## 2017-05-15 MED ORDER — SODIUM CHLORIDE 0.9 % IV SOLN
1.5000 g | Freq: Once | INTRAVENOUS | Status: AC
  Administered 2017-05-15: 1.5 g via INTRAVENOUS
  Filled 2017-05-15: qty 1.5

## 2017-05-15 MED ORDER — TRIAMCINOLONE ACETONIDE 0.5 % EX OINT: 1.0000 "application " | TOPICAL_OINTMENT | Freq: Two times a day (BID) | CUTANEOUS | 0 refills | Status: DC

## 2017-05-15 MED ORDER — AMOXICILLIN-POT CLAVULANATE 875-125 MG PO TABS: 1.0000 | ORAL_TABLET | Freq: Two times a day (BID) | ORAL | 0 refills | Status: AC

## 2017-05-15 MED ORDER — CIPROFLOXACIN HCL 750 MG PO TABS: 750.0000 mg | ORAL_TABLET | Freq: Two times a day (BID) | ORAL | 0 refills | Status: AC

## 2017-05-15 NOTE — ED Provider Notes (Signed)
[facility] Emergency Department Provider Note  ____________________________________________  Time seen: Approximately 5:11 PM  I have reviewed the triage vital signs and the nursing notes.   HISTORY  Chief Complaint Otalgia and Neck Pain    HPI Brandon Boyd is a 26 y.o. male that presents to the emergency department for evaluation of left ear redness and neck pain for 3 days. He states that it started out as a knot behind his ear that he tried to massage out. He still has a small lump below his ear on his neck, which is painful. He is not having any pain over the ear itself but it is red and swelling at the bottom.  No trauma. He was drinking on Christmas but states that he did not have any trauma to ear. He has a piercing in his ear but this is old. No known allergies. He thought about taking ibuprofen and benadryl but was told by a family member that they would not help.  No fever, chills, drainage from ear, difficulty hearing, SOB, CP.    He also states that he has a history of eczema and is out of the cream.    Past Medical History:  Diagnosis Date  . Eczema     There are no active problems to display for this patient.   No past surgical history on file.  Prior to Admission medications   Medication Sig Start Date End Date Taking? Authorizing Provider  amoxicillin-clavulanate (AUGMENTIN) 875-125 MG tablet Take 1 tablet by mouth 2 (two) times daily for 10 days. 05/15/17 05/25/17  Enid Derry, PA-C  ciprofloxacin (CIPRO) 750 MG tablet Take 1 tablet (750 mg total) by mouth 2 (two) times daily. 05/15/17   Enid Derry, PA-C  cyproheptadine (PERIACTIN) 4 MG tablet Take 1 tablet (4 mg total) by mouth 3 (three) times daily as needed for allergies. 05/14/15   Menshew, Charlesetta Ivory, PA-C  mupirocin nasal ointment (BACTROBAN NASAL) 2 % Place 1 application into the nose 2 (two) times daily. 12/30/15   Jeanmarie Plant, MD  nabumetone (RELAFEN) 750 MG  tablet Take 1 tablet (750 mg total) by mouth 2 (two) times daily. 10/03/16   Menshew, Charlesetta Ivory, PA-C  predniSONE (DELTASONE) 10 MG tablet Take 1 tablet (10 mg total) by mouth 2 (two) times daily with a meal. 05/14/15   Menshew, Charlesetta Ivory, PA-C  triamcinolone ointment (KENALOG) 0.5 % Apply 1 application topically 2 (two) times daily. 05/15/17   Enid Derry, PA-C    Allergies Patient has no known allergies.  No family history on file.  Social History Social History   Tobacco Use  . Smoking status: Current Every Day Smoker  Substance Use Topics  . Alcohol use: No  . Drug use: Not on file     Review of Systems  Constitutional: No fever/chills Cardiovascular: No chest pain. Respiratory: No SOB. Gastrointestinal: No abdominal pain.  No nausea, no vomiting.   Musculoskeletal: Negative for musculoskeletal pain. Skin: Negative for abrasions, lacerations, ecchymosis.   ____________________________________________   PHYSICAL EXAM:  VITAL SIGNS: ED Triage Vitals  Enc Vitals Group     BP 05/15/17 1602 (!) 151/95     Pulse Rate 05/15/17 1602 (!) 131     Resp 05/15/17 1602 15     Temp 05/15/17 1602 98 F (36.7 C)     Temp Source 05/15/17 1602 Oral     SpO2 05/15/17 1602 100 %     Weight 05/15/17 1603 195  lb (88.5 kg)     Height 05/15/17 1603 6\' 1"  (1.854 m)     Head Circumference --      Peak Flow --      Pain Score 05/15/17 1603 4     Pain Loc --      Pain Edu? --      Excl. in GC? --      Constitutional: Alert and oriented. Well appearing and in no acute distress. Eyes: Conjunctivae are normal. PERRL. EOMI. No discharge. Head: Atraumatic. ENT: No frontal and maxillary sinus tenderness.      Ears: Left tympanic membrane and ear canal are erythematous.  Moderate erythema and swelling to left pinna. No erythema or tenderness over Helix. No areas of fluctuance or drainable abscess. No pinpoint tenderness over mastoid. Palpable mobile and tender mass below ear,  consistent with lymph node. No discharge from ear.  Right tympanic membrane pearly.       Nose: No congestion/rhinnorhea.      Mouth/Throat: Mucous membranes are moist.  Neck: No stridor.   Hematological/Lymphatic/Immunilogical: No cervical lymphadenopathy. Cardiovascular: Tachycardic rate, regular rhythm.  Good peripheral circulation. Respiratory: Normal respiratory effort without tachypnea or retractions. Lungs CTAB. Good air entry to the bases with no decreased or absent breath sounds. Musculoskeletal: Full range of motion to all extremities. No gross deformities appreciated. Neurologic:  Normal speech and language. No gross focal neurologic deficits are appreciated.  Skin:  Skin is warm, dry and intact.   ____________________________________________   LABS (all labs ordered are listed, but only abnormal results are displayed)  Labs Reviewed  COMPREHENSIVE METABOLIC PANEL - Abnormal; Notable for the following components:      Result Value   Glucose, Bld 114 (*)    All other components within normal limits  CBC   ____________________________________________  EKG   ____________________________________________  RADIOLOGY   Ct Temporal Bones W Contrast  Result Date: 05/15/2017 CLINICAL DATA:  Left-sided ear pain for 3 days EXAM: CT TEMPORAL BONES WITH CONTRAST TECHNIQUE: Axial and coronal plane CT imaging of the petrous temporal bones was performed with thin-collimation image reconstruction after intravenous contrast administration. Multiplanar CT image reconstructions were also generated. CONTRAST:  75mL ISOVUE-300 IOPAMIDOL (ISOVUE-300) INJECTION 61% COMPARISON:  None. FINDINGS: RIGHT: --Pinna and external auditory canal: Normal. --Ossicular chain: Normal. No erosion or dislocation. --Tympanic membrane: Normal. --Middle ear: Normal. --Epitympanum: The Prussak space is clear. The scutum is sharp. Tegmen tympani is intact. --Cochlea, vestibule, vestibular aqueduct and semicircular  canals: Normal. No evidence of canal dehiscence or otospongiosis. --Internal auditory canal: Normal. No widening of the porus acusticus. --Facial nerve: No focal abnormality along the course of the facial nerve. --Cerebellopontine angle: Normal. --Petrous Apex: Normal. --Mastoids: Normal. --Carotid canal: Normal position. LEFT: --Pinna and external auditory canal: Normal. --Ossicular chain: Normal. No erosion or dislocation. --Tympanic membrane: Normal. --Middle ear: Normal. --Epitympanum: The Prussak space is clear. The scutum is sharp. Tegmen tympani is intact. --Cochlea, vestibule, vestibular aqueduct and semicircular canals: Normal. No evidence of canal dehiscence or otospongiosis. --Internal auditory canal: Normal. No widening of the porus acusticus. --Facial nerve: No focal abnormality along the course of the facial nerve. --Cerebellopontine angle: Normal. --Petrous Apex: Normal. --Mastoids: Normal. --Carotid canal: Normal position. OTHER: --Visualized intracranial: Normal. --Visualized paranasal sinuses: Normal. --Nasopharynx: Clear. --Temporomandibular joints: Normal. --Visualized extracranial soft tissues: Normal. IMPRESSION: Normal CT of the temporal bones.  No mastoid or middle ear effusion. Electronically Signed   By: Deatra RobinsonKevin  Herman M.D.   On: 05/15/2017 18:30  ____________________________________________    PROCEDURES  Procedure(s) performed:    Procedures   Medications  ampicillin-sulbactam (UNASYN) 1.5 g in sodium chloride 0.9 % 50 mL IVPB (not administered)  levofloxacin (LEVAQUIN) IVPB 500 mg (500 mg Intravenous New Bag/Given 05/15/17 1906)  iopamidol (ISOVUE-300) 61 % injection 75 mL (75 mLs Intravenous Contrast Given 05/15/17 1753)   ____________________________________________   INITIAL IMPRESSION / ASSESSMENT AND PLAN / ED COURSE  Pertinent labs & imaging results that were available during my care of the patient were reviewed by me and considered in my medical decision  making (see chart for details).  Review of the [place] CSRS was performed in accordance of the [facility] prior to dispensing any controlled drugs.   Patient presented to the emergency department for evaluation of ear redness and swelling for 3 days. Patient's diagnosis is consistent with perichondritis. Vital signs and exam are reassuring. CT temporal is negative for acute processes. I do not feel any drainable abscess over ear on exam. He was drinking alcohol before symptoms stared but denies any trauma to ear. He is tachycardic but he has a history of tachycardia and EKG is similar to EKG 1 year ago. No elevated WBC on CBC.  He was given an IV dose of levofloxacin and unasyn to cover for pseudomonas, staph, and strep. Patient feels comfortable going home.  Patient is agreeable to follow up with ED in the morning if symptoms do not improve or are worsening. Patient will be discharged home with prescriptions for Augmentin and ciprofloxacin. Patient is to follow up with ENT as needed or otherwise directed. Patient is given ED precautions to return to the ED for any worsening or new symptoms.     ____________________________________________  FINAL CLINICAL IMPRESSION(S) / ED DIAGNOSES  Final diagnoses:  Ear infection  Perichondritis  Eczema, unspecified type      NEW MEDICATIONS STARTED DURING THIS VISIT:  ED Discharge Orders        Ordered    ciprofloxacin (CIPRO) 750 MG tablet  2 times daily     05/15/17 1902    amoxicillin-clavulanate (AUGMENTIN) 875-125 MG tablet  2 times daily     05/15/17 1902    triamcinolone ointment (KENALOG) 0.5 %  2 times daily     05/15/17 1908          This chart was dictated using voice recognition software/Dragon. Despite best efforts to proofread, errors can occur which can change the meaning. Any change was purely unintentional.    Enid DerryWagner, Lindora Alviar, PA-C 05/15/17 1926    Emily FilbertWilliams, Jonathan E, MD 05/15/17 2120

## 2017-05-15 NOTE — ED Triage Notes (Signed)
Pt reports left ear pain and left sided neck pain associated with left ear pain. Airway intact, pt reports taking ibuprofen at home with little improvement.

## 2017-05-15 NOTE — ED Notes (Signed)
Spoke to Dr Lenard LancePaduchowski, no new orders at this time.

## 2017-07-25 ENCOUNTER — Emergency Department
Admission: EM | Admit: 2017-07-25 | Discharge: 2017-07-25 | Disposition: A | Discharge status: Home / Self Care | Payer: Self-pay | Attending: Emergency Medicine | Admitting: Emergency Medicine

## 2017-07-25 ENCOUNTER — Other Ambulatory Visit: Payer: Self-pay

## 2017-07-25 DIAGNOSIS — R21 Rash and other nonspecific skin eruption: Secondary | ICD-10-CM

## 2017-07-25 DIAGNOSIS — L309 Dermatitis, unspecified: Secondary | ICD-10-CM | POA: Insufficient documentation

## 2017-07-25 DIAGNOSIS — F17210 Nicotine dependence, cigarettes, uncomplicated: Secondary | ICD-10-CM | POA: Insufficient documentation

## 2017-07-25 MED ORDER — TRIAMCINOLONE ACETONIDE 0.5 % EX OINT
TOPICAL_OINTMENT | Freq: Once | CUTANEOUS | Status: AC
  Administered 2017-07-25: 1 via TOPICAL
  Filled 2017-07-25: qty 15

## 2017-07-25 MED ORDER — TRIAMCINOLONE ACETONIDE 0.5 % EX OINT: 1.0000 "application " | TOPICAL_OINTMENT | Freq: Two times a day (BID) | CUTANEOUS | 0 refills | Status: DC

## 2017-07-25 NOTE — ED Notes (Signed)
Pt states that he needs a refill for his medication for eczema.  Pt is A&Ox4, in NAD.

## 2017-07-25 NOTE — Discharge Instructions (Signed)
1. Apply triamcinolone ointment to affected areas twice daily. 2.  Return to the ER for worsening symptoms, fever, persistent vomiting or other concerns.

## 2017-07-25 NOTE — ED Provider Notes (Signed)
[facility] Emergency Department Provider Note   ____________________________________________   First MD Initiated Contact with Patient 07/25/17 [phone]     (approximate)  I have reviewed the triage vital signs and the nursing notes.   HISTORY  Chief Complaint Rash    HPI Brandon Boyd is a 27 y.o. male who presents to the ED from home with a chief complaint of eczematous rash.  Patient has a history of eczema and ran out of his triamcinolone cream.  Reports increased itchiness to his extremities and trunk.  Denies recent fever, chills, chest pain, shortness breath, pain, nausea, vomiting.  Denies recent travel or trauma.   Past Medical History:  Diagnosis Date  . Eczema     There are no active problems to display for this patient.   No past surgical history on file.  Prior to Admission medications   Medication Sig Start Date End Date Taking? Authorizing Provider  ciprofloxacin (CIPRO) 750 MG tablet Take 1 tablet (750 mg total) by mouth 2 (two) times daily. 05/15/17   Enid Derry, PA-C  cyproheptadine (PERIACTIN) 4 MG tablet Take 1 tablet (4 mg total) by mouth 3 (three) times daily as needed for allergies. 05/14/15   Menshew, Charlesetta Ivory, PA-C  mupirocin nasal ointment (BACTROBAN NASAL) 2 % Place 1 application into the nose 2 (two) times daily. 12/30/15   Jeanmarie Plant, MD  nabumetone (RELAFEN) 750 MG tablet Take 1 tablet (750 mg total) by mouth 2 (two) times daily. 10/03/16   Menshew, Charlesetta Ivory, PA-C  predniSONE (DELTASONE) 10 MG tablet Take 1 tablet (10 mg total) by mouth 2 (two) times daily with a meal. 05/14/15   Menshew, Charlesetta Ivory, PA-C  triamcinolone ointment (KENALOG) 0.5 % Apply 1 application topically 2 (two) times daily. 07/25/17   Irean Hong, MD    Allergies Patient has no known allergies.  No family history on file.  Social History Social History   Tobacco Use  . Smoking status: Current Every Day Smoker    Substance Use Topics  . Alcohol use: No  . Drug use: Not on file  History of methamphetamine use  Review of Systems  Constitutional: No fever/chills Eyes: No visual changes. ENT: No sore throat. Cardiovascular: Denies chest pain. Respiratory: Denies shortness of breath. Gastrointestinal: No abdominal pain.  No nausea, no vomiting.  No diarrhea.  No constipation. Genitourinary: Negative for dysuria. Musculoskeletal: Negative for back pain. Skin: Positive for rash. Neurological: Negative for headaches, focal weakness or numbness.   ____________________________________________   PHYSICAL EXAM:  VITAL SIGNS: ED Triage Vitals  Enc Vitals Group     BP 07/25/17 0117 (!) 143/90     Pulse Rate 07/25/17 0117 (!) 120     Resp 07/25/17 0117 18     Temp 07/25/17 0117 99 F (37.2 C)     Temp Source 07/25/17 0117 Oral     SpO2 07/25/17 0117 99 %     Weight 07/25/17 0033 190 lb (86.2 kg)     Height 07/25/17 0033 6' (1.829 m)     Head Circumference --      Peak Flow --      Pain Score --      Pain Loc --      Pain Edu? --      Excl. in GC? --     Constitutional: Alert and oriented. Well appearing and in no acute distress. Eyes: Conjunctivae are normal. PERRL. EOMI. wearing sunglasses chronically for  steroid-induced glaucoma. Head: Atraumatic. Nose: No congestion/rhinnorhea. Mouth/Throat: Mucous membranes are moist.  Oropharynx non-erythematous. Neck: No stridor.   Cardiovascular: Normal rate, regular rhythm. Grossly normal heart sounds.  Good peripheral circulation. Respiratory: Normal respiratory effort.  No retractions. Lungs CTAB. Gastrointestinal: Soft and nontender. No distention. No abdominal bruits. No CVA tenderness. Musculoskeletal: No lower extremity tenderness nor edema.  No joint effusions. Neurologic:  Normal speech and language. No gross focal neurologic deficits are appreciated. No gait instability. Skin:  Skin is warm, dry and intact.  Dry eczematous rash to  all extremities and trunk.  Several areas of excoriation which are not actively infected.   Psychiatric: Mood and affect are normal. Speech and behavior are normal.  ____________________________________________   LABS (all labs ordered are listed, but only abnormal results are displayed)  Labs Reviewed - No data to display ____________________________________________  EKG  None ____________________________________________  RADIOLOGY  ED MD interpretation: None  Official radiology report(s): No results found.  ____________________________________________   PROCEDURES  Procedure(s) performed: None  Procedures  Critical Care performed: No  ____________________________________________   INITIAL IMPRESSION / ASSESSMENT AND PLAN / ED COURSE  As part of my medical decision making, I reviewed the following data within the electronic MEDICAL RECORD NUMBER Nursing notes reviewed and incorporated, Old chart reviewed and Notes from prior ED visits   27 year old male with eczematous rash presenting for triamcinolone ointment.  No active signs of infection despite several areas of excoriated skin.  Strict return precautions given.  Patient verbalizes understanding and agrees with plan of care.      ____________________________________________   FINAL CLINICAL IMPRESSION(S) / ED DIAGNOSES  Final diagnoses:  Eczema, unspecified type  Rash     ED Discharge Orders        Ordered    triamcinolone ointment (KENALOG) 0.5 %  2 times daily     07/25/17 0242       Note:  This document was prepared using Dragon voice recognition software and may include unintentional dictation errors.    Irean Hong, MD 07/25/17 [phone]

## 2017-07-25 NOTE — ED Notes (Signed)
Pt. Going home with friend,.

## 2017-07-25 NOTE — ED Triage Notes (Signed)
Pt reports started yesterday with a rash that is on his hands and has moved to his arms and legs. Has some on his back and in his scalp. Pt talking in full and complete sentences 2with no difficulty. Pt reports he took zyrtec around 330 pm on Friday. Has been taking it for about a week.

## 2017-07-25 NOTE — ED Notes (Signed)
[facility]

## 2017-08-30 ENCOUNTER — Emergency Department
Admission: EM | Admit: 2017-08-30 | Discharge: 2017-08-30 | Disposition: A | Discharge status: Home / Self Care | Payer: Self-pay | Attending: Student in an Organized Health Care Education/Training Program | Admitting: Student in an Organized Health Care Education/Training Program

## 2017-08-30 ENCOUNTER — Encounter: Payer: Self-pay | Admitting: Emergency Medicine

## 2017-08-30 ENCOUNTER — Other Ambulatory Visit: Payer: Self-pay

## 2017-08-30 DIAGNOSIS — R600 Localized edema: Secondary | ICD-10-CM | POA: Insufficient documentation

## 2017-08-30 DIAGNOSIS — R220 Localized swelling, mass and lump, head: Secondary | ICD-10-CM

## 2017-08-30 DIAGNOSIS — F17210 Nicotine dependence, cigarettes, uncomplicated: Secondary | ICD-10-CM | POA: Insufficient documentation

## 2017-08-30 NOTE — ED Triage Notes (Signed)
Pt arrives ambulatory to triage with c/o allergic reaction or "swelling" on the left side of head. Pt reports that he used some antibiotics which he had and states "but like they were within the date". Pt is in NAD.

## 2017-08-30 NOTE — ED Notes (Signed)
Pt reports swelling and redness at left temple (appears red and raised), also at left and right upper cervical (no redness but firm), pt has hx of eczema and taking steroids for same, pt reports taking previously prescribed ABX for condition - pt advised to stop and discard used portion  Pt with pressured and rapid speech, admits to ETOH tonight, with sig other, NAD

## 2017-08-30 NOTE — ED Provider Notes (Signed)
[facility] Emergency Department Provider Note    First MD Initiated Contact with Patient 08/30/17 [phone]     (approximate)  I have reviewed the triage vital signs and the nursing notes.   HISTORY  Chief Complaint Allergic Reaction    HPI Brandon Boyd is a 27 y.o. male with a history of eczema presents to the ER with 2 days of painful bumps in the back of his neck as well as some front of his left ear with redness and a little bit of skin swelling.  Patient denies any shortness of breath or chest pain.  No nausea or vomiting.  Patient states he did start taking Augmentin as well as Cipro that were previously prescribed and has had some improvement in symptoms but is still persisting.  No other associated symptoms.  Past Medical History:  Diagnosis Date  . Eczema    No family history on file. Past Surgical History:  Procedure Laterality Date  . EYE SURGERY     There are no active problems to display for this patient.     Prior to Admission medications   Medication Sig Start Date End Date Taking? Authorizing Provider  ciprofloxacin (CIPRO) 750 MG tablet Take 1 tablet (750 mg total) by mouth 2 (two) times daily. 05/15/17   Enid Derry, PA-C  cyproheptadine (PERIACTIN) 4 MG tablet Take 1 tablet (4 mg total) by mouth 3 (three) times daily as needed for allergies. 05/14/15   Menshew, Charlesetta Ivory, PA-C  mupirocin nasal ointment (BACTROBAN NASAL) 2 % Place 1 application into the nose 2 (two) times daily. 12/30/15   Jeanmarie Plant, MD  nabumetone (RELAFEN) 750 MG tablet Take 1 tablet (750 mg total) by mouth 2 (two) times daily. 10/03/16   Menshew, Charlesetta Ivory, PA-C  predniSONE (DELTASONE) 10 MG tablet Take 1 tablet (10 mg total) by mouth 2 (two) times daily with a meal. 05/14/15   Menshew, Charlesetta Ivory, PA-C  triamcinolone ointment (KENALOG) 0.5 % Apply 1 application topically 2 (two) times daily. 07/25/17   Irean Hong, MD     Allergies Patient has no known allergies.    Social History Social History   Tobacco Use  . Smoking status: Current Every Day Smoker    Packs/day: 1.00  . Smokeless tobacco: Never Used  Substance Use Topics  . Alcohol use: Yes  . Drug use: Never    Review of Systems Patient denies headaches, rhinorrhea, blurry vision, numbness, shortness of breath, chest pain, edema, cough, abdominal pain, nausea, vomiting, diarrhea, dysuria, fevers, rashes or hallucinations unless otherwise stated above in HPI. ____________________________________________   PHYSICAL EXAM:  VITAL SIGNS: Vitals:   08/30/17 0606  BP: (!) 145/86  Pulse: (!) 105  Resp: 18  Temp: (!) 97.5 F (36.4 C)  SpO2: 98%    Constitutional: Alert and oriented. Well appearing and in no acute distress. Eyes: Conjunctivae are normal.  Head: Atraumatic.  There is tender lymphadenopathy anterior to the left auricle with small amount of surrounding redness but no urticaria.  No fluctuance. Ears are with clear TMs bilaterally. Nose: No congestion/rhinnorhea. Mouth/Throat: Mucous membranes are moist.  No Trismus. Neck: Painless ROM.  Cardiovascular:   Good peripheral circulation. Respiratory: Normal respiratory effort.  No retractions.  Gastrointestinal: Soft and nontender.  Musculoskeletal: No lower extremity tenderness .  No joint effusions. Neurologic:  Normal speech and language. No gross focal neurologic deficits are appreciated.  Skin:  Skin is warm, dry and intact.  Diffuse eczematous rash Psychiatric: Mood and affect are normal. Speech and behavior are normal.  ____________________________________________   LABS (all labs ordered are listed, but only abnormal results are displayed)  No results found for this or any previous visit (from the past 24 hour(s)). ____________________________________________ ____________________________________________   PROCEDURES  Procedure(s) performed:   Procedures    Critical Care performed: no ____________________________________________   INITIAL IMPRESSION / ASSESSMENT AND PLAN / ED COURSE  Pertinent labs & imaging results that were available during my care of the patient were reviewed by me and considered in my medical decision making (see chart for details).  DDX: urticaria, lymphadenitis, auriculitis, mastoiditis, cellulitis, allergic reaction  Brandon Boyd is a 27 y.o. who presents to the ED with symptoms as described above.  Patient in no acute distress.  Well-appearing.  Does appear slightly anxious but otherwise well perfused.  Exam as above.  Not clinically consistent with mastoiditis pharyngitis or otitis.  Area of swelling on the left face seems to be localized to tender lymphadenopathy suggesting lymphadenitis particularly given improvement with Augmentin.  Have recommended continued treatment on this and discussed signs and symptoms for which he should return to the ER.  Does not seem clinically consistent with allergic reaction.  He is already has follow-up with dermatology which I recommended he see again.      ____________________________________________   FINAL CLINICAL IMPRESSION(S) / ED DIAGNOSES  Final diagnoses:  Facial swelling      NEW MEDICATIONS STARTED DURING THIS VISIT:  New Prescriptions   No medications on file     Note:  This document was prepared using Dragon voice recognition software and may include unintentional dictation errors.     Willy Eddyobinson, Shalva Rozycki, MD 08/30/17 [phone]

## 2017-08-30 NOTE — Discharge Instructions (Addendum)
Please follow-up with dermatology.  I would not recommend taking ciprofloxacin so would not be beneficial.  Return to the ER should he develop any swelling in her throat or difficulty swallowing or breathing.  He can use Benadryl for itching.  I would recommend giving this 1-2 days to evaluate for improvement and return or follow-up with your primary care doctor then if it is not better.

## 2019-01-02 IMAGING — DX DG FOOT COMPLETE 3+V*R*
2 series · 2 of 2 positions shown · non-contrast
Comparison: None.

CLINICAL DATA: Right ankle and foot pain after slipping last
evening.

EXAM:
RIGHT FOOT COMPLETE - 3+ VIEW

[foot ap]
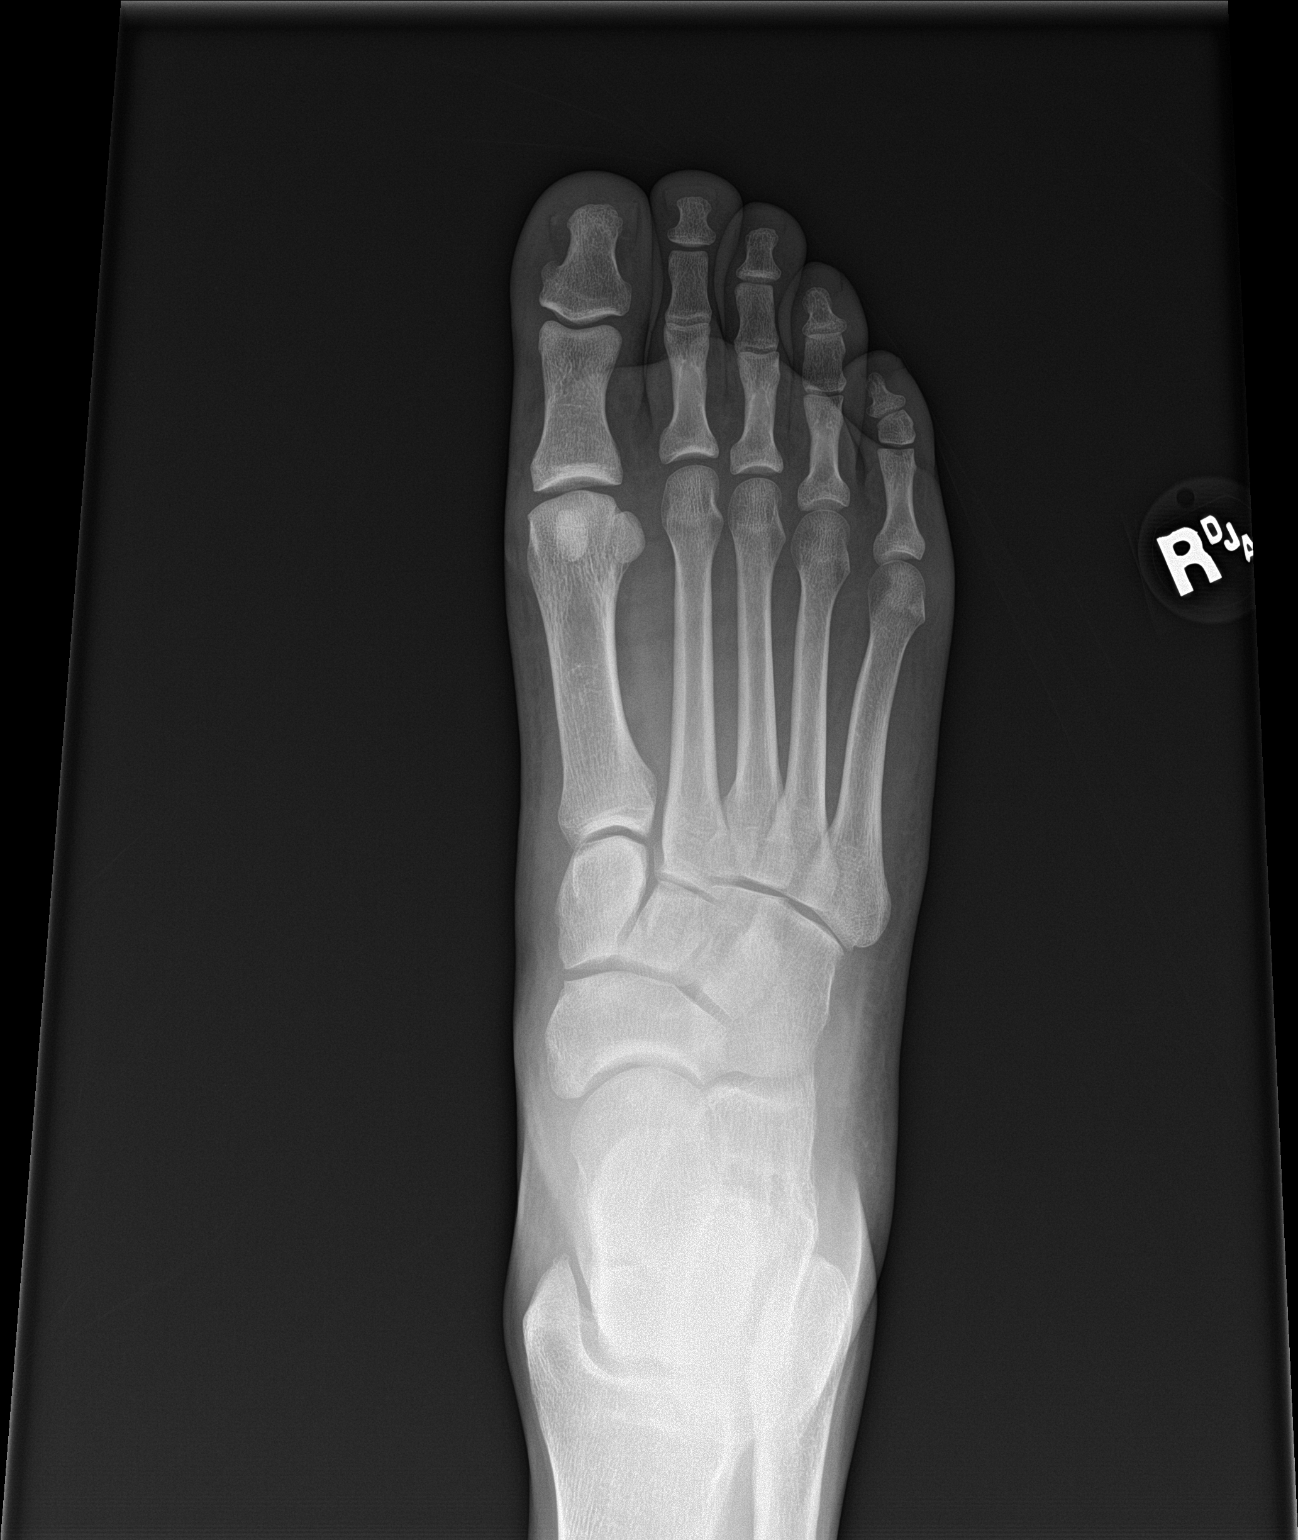

[foot obl]
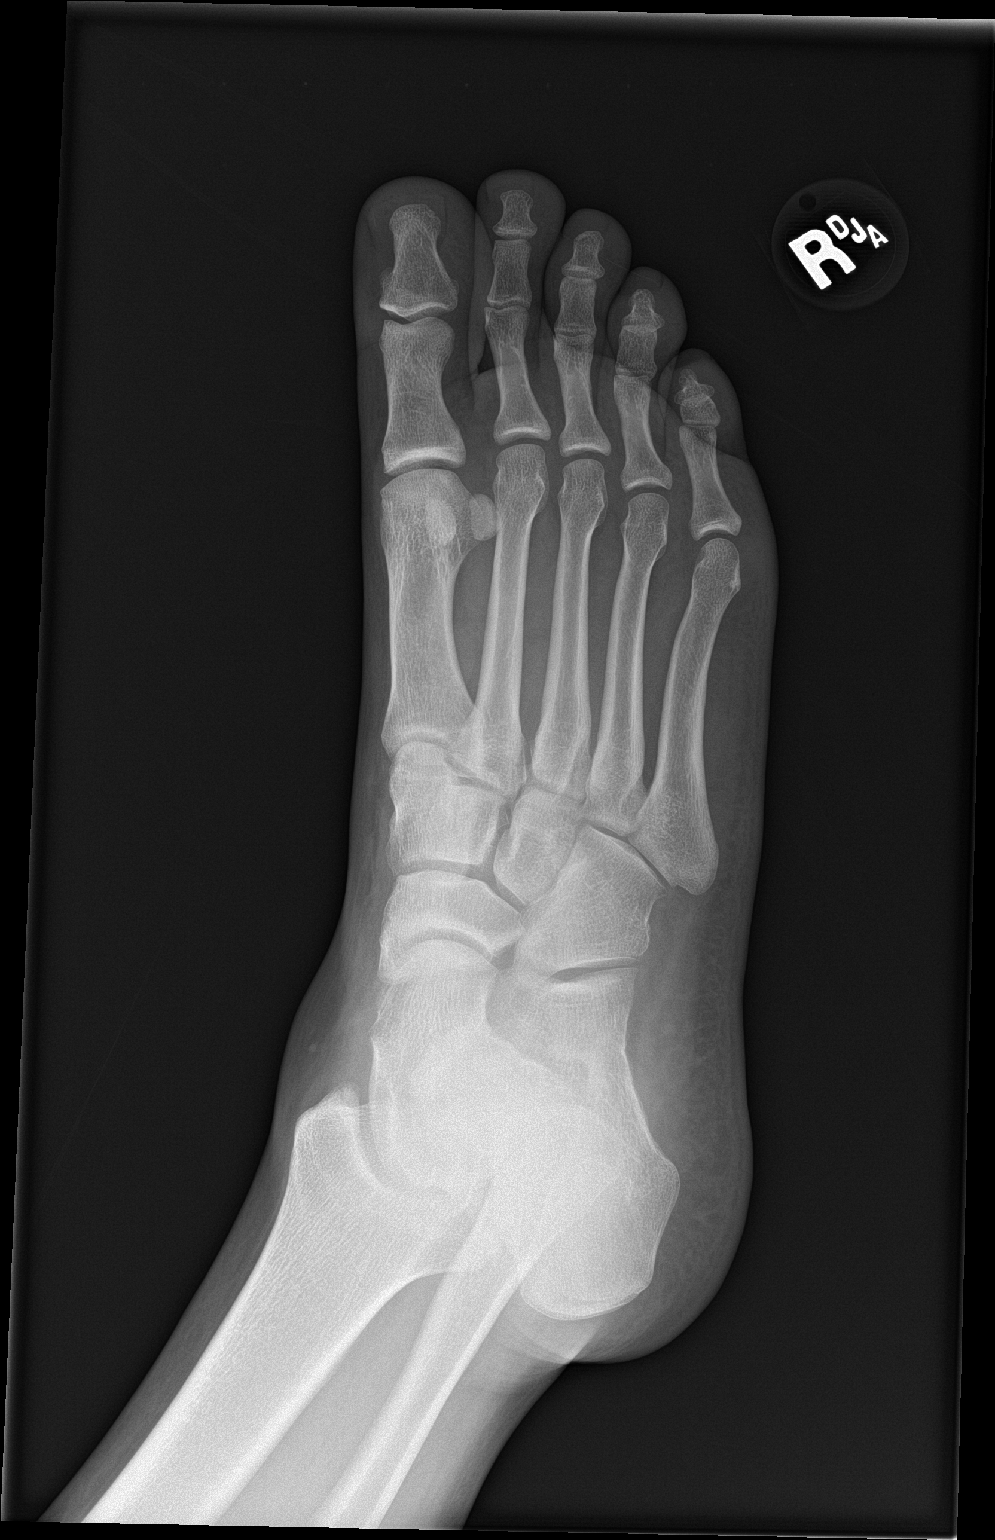

[2 of 2 positions shown; findings below may reference images not displayed]

FINDINGS: There is soft tissue swelling about the ankle and dorsum of the
midfoot. Tiny ossific density is seen off the dorsum of the midfoot
at the level of the cuneiform suspicious for tiny capsular avulsion.
No dislocation is noted. The trace ankle joint effusion is seen.
There is a small plantar calcaneal enthesophyte.
IMPRESSION: Soft tissue swelling about the ankle and dorsum of the midfoot. Tiny
ossific density off the dorsum of the midfoot may represent a tiny
capsular avulsion off the navicular bone. Small plantar calcaneal
enthesophyte.

## 2019-01-02 IMAGING — DX DG ANKLE COMPLETE 3+V*R*
1 series · 1 of 1 positions shown · non-contrast
Comparison: None.

CLINICAL DATA: Ankle pain after slip last evening.

EXAM:
RIGHT ANKLE - COMPLETE 3+ VIEW

[foot lat]
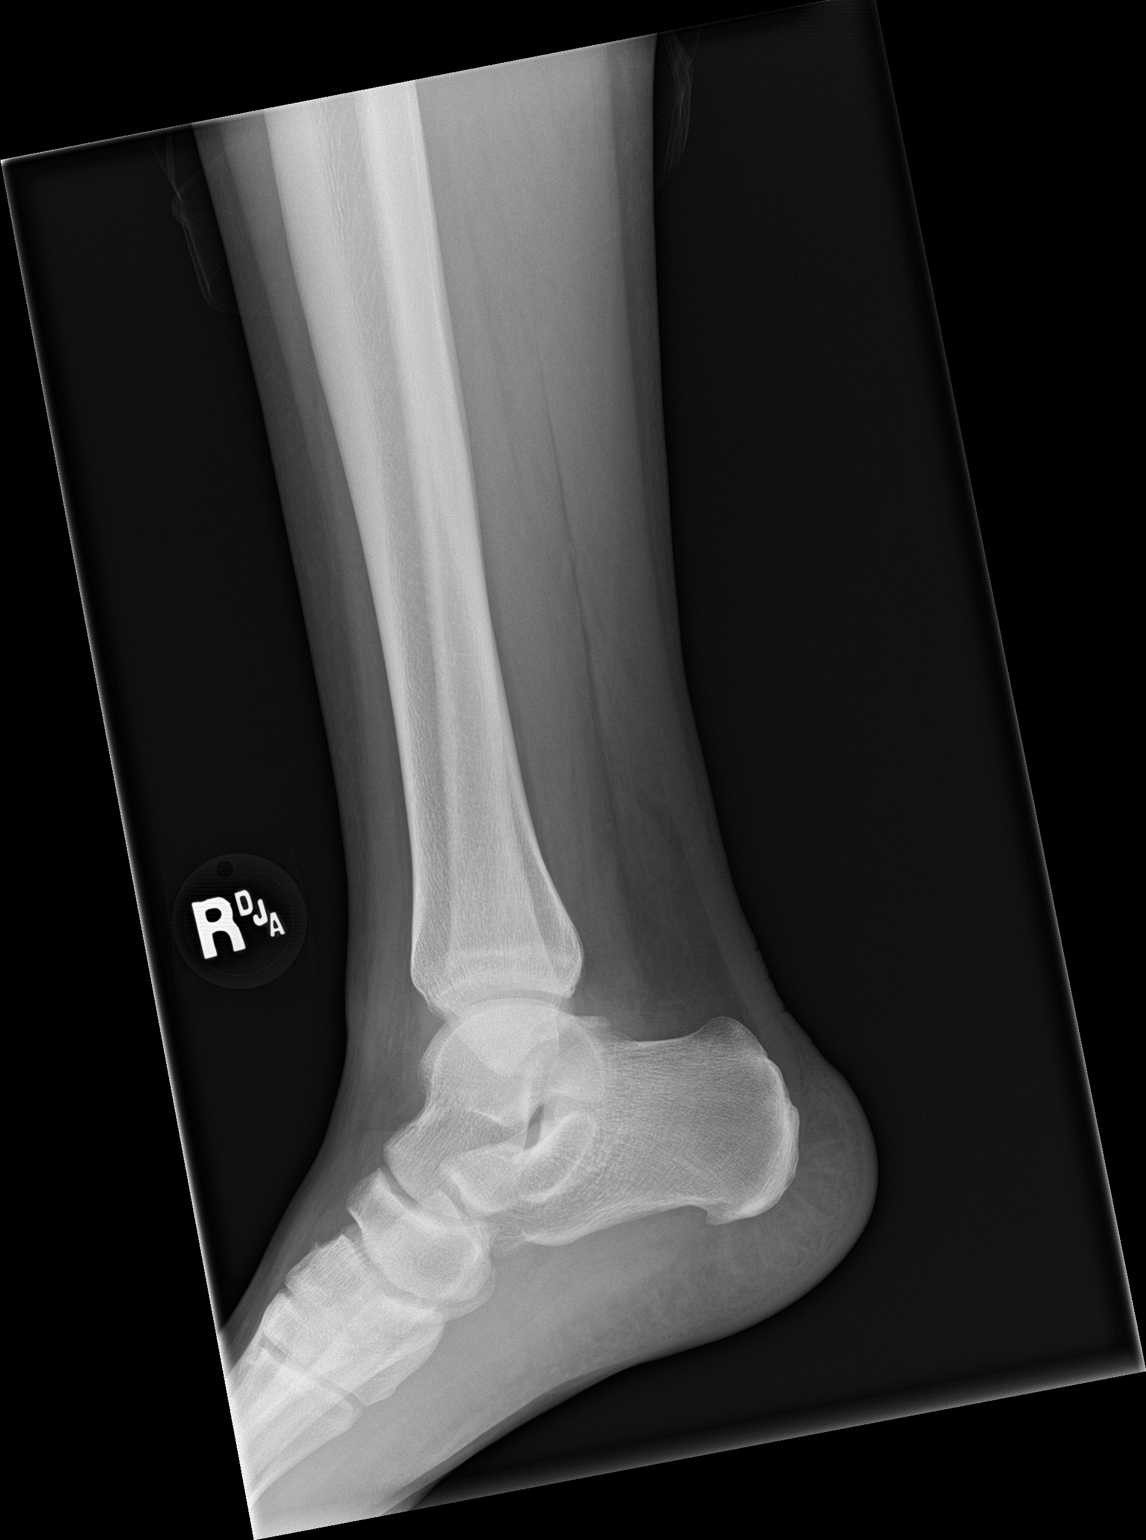

[1 of 1 positions shown; findings below may reference images not displayed]

FINDINGS: Soft tissue swelling over the lateral malleolus and dorsum of the
midfoot. Ankle mortise is maintained. Trace ankle joint effusion is
noted. Small plantar calcaneal enthesophyte is seen. No dislocation
or fracture is apparent. There is a tiny ossific density off the
dorsum of one of the cuneiform on the lateral view which could
potentially represent a tiny joint capsule capsular avulsion.
IMPRESSION: 1. Soft tissue swelling about the lateral malleolus and dorsum of
the midfoot.
2. Tiny ossific density is noted over the dorsum of the midfoot at
the level of the cuneiform. A tiny joint capsule avulsion is not
excluded.
3. Plantar calcaneal enthesophyte.

## 2019-03-06 ENCOUNTER — Emergency Department
Admission: EM | Admit: 2019-03-06 | Discharge: 2019-03-06 | Disposition: A | Discharge status: Home / Self Care | Payer: Self-pay | Attending: Emergency Medicine | Admitting: Emergency Medicine

## 2019-03-06 ENCOUNTER — Other Ambulatory Visit: Payer: Self-pay

## 2019-03-06 ENCOUNTER — Emergency Department: Payer: Self-pay

## 2019-03-06 DIAGNOSIS — F17210 Nicotine dependence, cigarettes, uncomplicated: Secondary | ICD-10-CM | POA: Insufficient documentation

## 2019-03-06 DIAGNOSIS — K219 Gastro-esophageal reflux disease without esophagitis: Secondary | ICD-10-CM | POA: Insufficient documentation

## 2019-03-06 DIAGNOSIS — R070 Pain in throat: Secondary | ICD-10-CM | POA: Insufficient documentation

## 2019-03-06 DIAGNOSIS — R0789 Other chest pain: Secondary | ICD-10-CM | POA: Insufficient documentation

## 2019-03-06 DIAGNOSIS — R05 Cough: Secondary | ICD-10-CM | POA: Insufficient documentation

## 2019-03-06 LAB — CBC
HCT: 47.6 % (ref 39.0–52.0)
Hemoglobin: 16.5 g/dL (ref 13.0–17.0)
MCH: 32.4 pg (ref 26.0–34.0)
MCHC: 34.7 g/dL (ref 30.0–36.0)
MCV: 93.5 fL (ref 80.0–100.0)
Platelets: 200 10*3/uL (ref 150–400)
RBC: 5.09 MIL/uL (ref 4.22–5.81)
RDW: 12.5 % (ref 11.5–15.5)
WBC: 9.7 10*3/uL (ref 4.0–10.5)
nRBC: 0 % (ref 0.0–0.2)

## 2019-03-06 LAB — BASIC METABOLIC PANEL
Anion gap: 11 (ref 5–15)
BUN: 14 mg/dL (ref 6–20)
CO2: 22 mmol/L (ref 22–32)
Calcium: 9.3 mg/dL (ref 8.9–10.3)
Chloride: 105 mmol/L (ref 98–111)
Creatinine, Ser: 0.83 mg/dL (ref 0.61–1.24)
GFR calc Af Amer: >60 mL/min (ref >60)
GFR calc non Af Amer: >60 mL/min (ref >60)
Glucose, Bld: 109 mg/dL — ABNORMAL HIGH (ref 70–99)
Potassium: 3.8 mmol/L (ref 3.5–5.1)
Sodium: 138 mmol/L (ref 135–145)

## 2019-03-06 LAB — TROPONIN I (HIGH SENSITIVITY): Troponin I (High Sensitivity): 3 ng/L (ref <18)

## 2019-03-06 MED ORDER — ALUMINUM-MAGNESIUM-SIMETHICONE 200-200-20 MG/5ML PO SUSP: 30.0000 mL | Freq: Three times a day (TID) | ORAL | 0 refills | Status: AC

## 2019-03-06 MED ORDER — ALUM & MAG HYDROXIDE-SIMETH 200-200-20 MG/5ML PO SUSP
30.0000 mL | Freq: Once | ORAL | Status: AC
  Administered 2019-03-06: 03:00:00 30 mL via ORAL
  Filled 2019-03-06: qty 30

## 2019-03-06 MED ORDER — FAMOTIDINE 20 MG PO TABS: 20.0000 mg | ORAL_TABLET | Freq: Two times a day (BID) | ORAL | 0 refills | Status: AC

## 2019-03-06 MED ORDER — SODIUM CHLORIDE 0.9% FLUSH: 3.0000 mL | Freq: Once | INTRAVENOUS | Status: DC

## 2019-03-06 MED ORDER — FAMOTIDINE 20 MG PO TABS
40.0000 mg | ORAL_TABLET | Freq: Once | ORAL | Status: AC
  Administered 2019-03-06: 03:00:00 40 mg via ORAL
  Filled 2019-03-06: qty 2

## 2019-03-06 NOTE — ED Notes (Signed)
Pt sitting in the waiting room, waiting for treatment room; has been on his cell phone most of the time; can be heard talking in complete coherent sentences;

## 2019-03-06 NOTE — ED Provider Notes (Signed)
[facility] Emergency Department Provider Note  ____________________________________________  Time seen: Approximately 3:46 AM  I have reviewed the triage vital signs and the nursing notes.   HISTORY  Chief Complaint Chest Pain    HPI Wolfe Loel RoSingh Kusek is a 28 y.o. male with a history of eczema who comes the ED complaining of intermittent chest discomfort radiating to the throat associated with a dry cough and sore throat when waking up, worse at night lying down, resolves during the daytime.  No other aggravating or alleviating factors, not pleuritic, not exertional.   Believes that using an albuterol nebulizer helps him.  No fevers or chills.  Cough is not productive.     Past Medical History:  Diagnosis Date  . Eczema      There are no active problems to display for this patient.    Past Surgical History:  Procedure Laterality Date  . EYE SURGERY       Prior to Admission medications   Medication Sig Start Date End Date Taking? Authorizing Provider  aluminum-magnesium hydroxide-simethicone (MAALOX) 200-200-20 MG/5ML SUSP Take 30 mLs by mouth 4 (four) times daily -  before meals and at bedtime. 03/06/19   Sharman CheekStafford, Sumie Remsen, MD  ciprofloxacin (CIPRO) 750 MG tablet Take 1 tablet (750 mg total) by mouth 2 (two) times daily. 05/15/17   Enid DerryWagner, Ashley, PA-C  cyproheptadine (PERIACTIN) 4 MG tablet Take 1 tablet (4 mg total) by mouth 3 (three) times daily as needed for allergies. 05/14/15   Menshew, Charlesetta IvoryJenise V Bacon, PA-C  famotidine (PEPCID) 20 MG tablet Take 1 tablet (20 mg total) by mouth 2 (two) times daily. 03/06/19   Sharman CheekStafford, Byren Pankow, MD  mupirocin nasal ointment (BACTROBAN NASAL) 2 % Place 1 application into the nose 2 (two) times daily. 12/30/15   Jeanmarie PlantMcShane, James A, MD  nabumetone (RELAFEN) 750 MG tablet Take 1 tablet (750 mg total) by mouth 2 (two) times daily. 10/03/16   Menshew, Charlesetta IvoryJenise V Bacon, PA-C  predniSONE (DELTASONE) 10 MG tablet Take 1  tablet (10 mg total) by mouth 2 (two) times daily with a meal. 05/14/15   Menshew, Charlesetta IvoryJenise V Bacon, PA-C  triamcinolone ointment (KENALOG) 0.5 % Apply 1 application topically 2 (two) times daily. 07/25/17   Irean HongSung, Jade J, MD     Allergies Patient has no known allergies.   No family history on file.  Social History Social History   Tobacco Use  . Smoking status: Current Every Day Smoker    Packs/day: 1.00  . Smokeless tobacco: Never Used  Substance Use Topics  . Alcohol use: Yes  . Drug use: Never    Review of Systems  Constitutional:   No fever or chills.  ENT:   No sore throat. No rhinorrhea. Cardiovascular: Positive as above chest discomfort without palpitations or syncope. Respiratory:   No dyspnea, positive nonproductive cough. Gastrointestinal:   Negative for abdominal pain, vomiting and diarrhea.  Musculoskeletal:   Negative for focal pain or swelling All other systems reviewed and are negative except as documented above in ROS and HPI.  ____________________________________________   PHYSICAL EXAM:  VITAL SIGNS: ED Triage Vitals  Enc Vitals Group     BP 03/06/19 0230 126/83     Pulse Rate 03/06/19 0230 87     Resp 03/06/19 0230 (!) 29     Temp --      Temp src --      SpO2 03/06/19 0230 97 %     Weight 03/06/19 0044 220 lb (99.8  kg)     Height 03/06/19 0044 6\' 1"  (1.854 m)     Head Circumference --      Peak Flow --      Pain Score 03/06/19 0043 2     Pain Loc --      Pain Edu? --      Excl. in GC? --     Vital signs reviewed, nursing assessments reviewed.   Constitutional:   Alert and oriented. Non-toxic appearance. Eyes:   Conjunctivae are normal. EOMI. PERRL. ENT      Head:   Normocephalic and atraumatic.      Nose:   Wearing a mask.      Mouth/Throat:   Wearing a mask.      Neck:   No meningismus. Full ROM. Hematological/Lymphatic/Immunilogical:   No cervical lymphadenopathy. Cardiovascular:   RRR. Symmetric bilateral radial and DP pulses.  No  murmurs. Cap refill less than 2 seconds. Respiratory:   Normal respiratory effort without tachypnea/retractions. Breath sounds are clear and equal bilaterally. No wheezes/rales/rhonchi.  No inducible wheezing with FEV1 maneuver Gastrointestinal:   Soft and nontender. Non distended. There is no CVA tenderness.  No rebound, rigidity, or guarding.  Musculoskeletal:   Normal range of motion in all extremities. No joint effusions.  No lower extremity tenderness.  No edema. Neurologic:   Normal speech and language.  Motor grossly intact. No acute focal neurologic deficits are appreciated.  Skin:    Skin is warm, dry and intact. No rash noted.  No petechiae, purpura, or bullae.  ____________________________________________    LABS (pertinent positives/negatives) (all labs ordered are listed, but only abnormal results are displayed) Labs Reviewed  BASIC METABOLIC PANEL - Abnormal; Notable for the following components:      Result Value   Glucose, Bld 109 (*)    All other components within normal limits  CBC  TROPONIN I (HIGH SENSITIVITY)  TROPONIN I (HIGH SENSITIVITY)   ____________________________________________   EKG  Interpreted by me Sinus tachycardia rate 113, normal axis intervals QRS ST segments and T waves.  ____________________________________________    RADIOLOGY  Dg Chest 2 View  Result Date: 03/06/2019 CLINICAL DATA:  Initial evaluation for acute chest pain. EXAM: CHEST - 2 VIEW COMPARISON:  None available. FINDINGS: The cardiac and mediastinal silhouettes are within normal limits. The lungs are normally inflated. No airspace consolidation, pleural effusion, or pulmonary edema. No pneumothorax. No acute osseous abnormality. IMPRESSION: No active cardiopulmonary disease. Electronically Signed   By: 03/08/2019 M.D.   On: 03/06/2019 01:10     ____________________________________________   PROCEDURES Procedures  ____________________________________________    CLINICAL IMPRESSION / ASSESSMENT AND PLAN / ED COURSE  Medications ordered in the ED: Medications  sodium chloride flush (NS) 0.9 % injection 3 mL (3 mLs Intravenous Not Given 03/06/19 0214)  alum & mag hydroxide-simeth (MAALOX/MYLANTA) 200-200-20 MG/5ML suspension 30 mL (30 mLs Oral Given 03/06/19 0319)  famotidine (PEPCID) tablet 40 mg (40 mg Oral Given 03/06/19 0319)    Pertinent labs & imaging results that were available during my care of the patient were reviewed by me and considered in my medical decision making (see chart for details).  Jahlen Other Atienza was evaluated in Emergency Department on 03/06/2019 for the symptoms described in the history of present illness. He was evaluated in the context of the global COVID-19 pandemic, which necessitated consideration that the patient might be at risk for infection with the SARS-CoV-2 virus that causes COVID-19. Institutional protocols and algorithms that pertain to  the evaluation of patients at risk for COVID-19 are in a state of rapid change based on information released by regulatory bodies including the CDC and federal and state organizations. These policies and algorithms were followed during the patient's care in the ED.   Patient presents with atypical chest discomfort, positional, very suspicious for GERD.Considering the patient's symptoms, medical history, and physical examination today, I have low suspicion for ACS, PE, TAD, pneumothorax, carditis, mediastinitis, pneumonia, CHF, or sepsis.  Do trial of Maalox and Pepcid, recommend follow-up with primary care.      ____________________________________________   FINAL CLINICAL IMPRESSION(S) / ED DIAGNOSES    Final diagnoses:  Atypical chest pain  Gastroesophageal reflux disease without esophagitis     ED Discharge Orders         Ordered     aluminum-magnesium hydroxide-simethicone (MAALOX) 200-200-20 MG/5ML SUSP  3 times daily before meals & bedtime     03/06/19 0346    famotidine (PEPCID) 20 MG tablet  2 times daily     03/06/19 0346          Portions of this note were generated with dragon dictation software. Dictation errors may occur despite best attempts at proofreading.   Carrie Mew, MD 03/06/19 [phone]

## 2019-03-06 NOTE — ED Triage Notes (Signed)
Patient reports symptoms off/on for several days, states worse in morning and just prior to sleep.  Reports also has had shortness of breath.

## 2019-03-06 NOTE — ED Notes (Signed)
Pt back from xray; ambulatory with steady gait;

## 2021-02-14 ENCOUNTER — Ambulatory Visit: Payer: Self-pay

## 2021-02-14 ENCOUNTER — Other Ambulatory Visit: Payer: Self-pay

## 2021-02-14 ENCOUNTER — Ambulatory Visit
Admission: EM | Admit: 2021-02-14 | Discharge: 2021-02-14 | Disposition: A | Discharge status: Home / Self Care | Payer: Self-pay | Attending: Student | Admitting: Student

## 2021-02-14 DIAGNOSIS — L309 Dermatitis, unspecified: Secondary | ICD-10-CM

## 2021-02-14 DIAGNOSIS — S62342A Nondisplaced fracture of base of third metacarpal bone, right hand, initial encounter for closed fracture: Secondary | ICD-10-CM

## 2021-02-14 MED ORDER — CEPHALEXIN 500 MG PO CAPS: 500.0000 mg | ORAL_CAPSULE | Freq: Four times a day (QID) | ORAL | 0 refills | Status: AC

## 2021-02-14 MED ORDER — TRIAMCINOLONE ACETONIDE 0.5 % EX OINT: 1.0000 "application " | TOPICAL_OINTMENT | Freq: Two times a day (BID) | CUTANEOUS | 0 refills | Status: AC

## 2021-02-14 NOTE — Discharge Instructions (Addendum)
-  Use cream as prescribed on affected area to the right arm. -If you begin to notice any purulent drainage coming from the area begin the Keflex that was prescribed for you. -Purchase a over-the-counter volar wrist splint to begin to wear at all times and buddy tape the fourth and fifth digits at this time. -Follow-up with orthopedic provider information provided.

## 2021-02-14 NOTE — ED Provider Notes (Signed)
[facility]    CSN: [id number] Arrival date & time: 02/14/21  1633      History   Chief Complaint Chief Complaint  Patient presents with   Eczema    HPI Brandon Boyd is a 30 y.o. male who presents today for evaluation of right elbow rash.  The patient does have a history of eczema and does use triamcinolone cream however he ran out several days ago.  Because of this the rash along the dorsal aspect of his right upper extremity became irritated and he reports itching it more than normal over the past 2 days.  The patient states over the past 2 days he has noticed increased redness around this area but denies any fevers or chills.  He denies any drainage coming from the rash to right upper extremity.  He denies any significant swelling to the right upper extremity as well.  The patient also reports that 2 weeks ago he was at the arcade playing the "game where you punched the bag" and states that when he swung with his right hand he hit the wall behind the game by accident.  The patient had immediate pain and did notice some increased swelling.  However he has since been able to return back to his normal activities.  He reports a deformity to the right fifth digit but denies any loss of range of motion or loss of grip strength to the right hand.  He is right-hand dominant.  He denies any surgical history to the right upper extremity.  He has not been taking anything for pain at this time.  HPI  Past Medical History:  Diagnosis Date   Eczema     There are no problems to display for this patient.   Past Surgical History:  Procedure Laterality Date   EYE SURGERY         Home Medications    Prior to Admission medications   Medication Sig Start Date End Date Taking? Authorizing Provider  cephALEXin (KEFLEX) 500 MG capsule Take 1 capsule (500 mg total) by mouth 4 (four) times daily. 02/14/21  Yes Anson Oregon, PA-C  aluminum-magnesium hydroxide-simethicone  (MAALOX) 200-200-20 MG/5ML SUSP Take 30 mLs by mouth 4 (four) times daily -  before meals and at bedtime. 03/06/19   Sharman Cheek, MD  ciprofloxacin (CIPRO) 750 MG tablet Take 1 tablet (750 mg total) by mouth 2 (two) times daily. 05/15/17   Enid Derry, PA-C  cyproheptadine (PERIACTIN) 4 MG tablet Take 1 tablet (4 mg total) by mouth 3 (three) times daily as needed for allergies. 05/14/15   Menshew, Charlesetta Ivory, PA-C  famotidine (PEPCID) 20 MG tablet Take 1 tablet (20 mg total) by mouth 2 (two) times daily. 03/06/19   Sharman Cheek, MD  mupirocin nasal ointment (BACTROBAN NASAL) 2 % Place 1 application into the nose 2 (two) times daily. 12/30/15   Jeanmarie Plant, MD  nabumetone (RELAFEN) 750 MG tablet Take 1 tablet (750 mg total) by mouth 2 (two) times daily. 10/03/16   Menshew, Charlesetta Ivory, PA-C  predniSONE (DELTASONE) 10 MG tablet Take 1 tablet (10 mg total) by mouth 2 (two) times daily with a meal. 05/14/15   Menshew, Charlesetta Ivory, PA-C  triamcinolone ointment (KENALOG) 0.5 % Apply 1 application topically 2 (two) times daily. 02/14/21   Anson Oregon, PA-C    Family History No family history on file.  Social History Social History   Tobacco Use   Smoking status:  Every Day    Packs/day: 1.00    Types: Cigarettes   Smokeless tobacco: Never  Substance Use Topics   Alcohol use: Yes   Drug use: Never     Allergies   Patient has no known allergies.   Review of Systems Review of Systems  Musculoskeletal:  Positive for arthralgias and joint swelling.  Skin:  Positive for rash.  All other systems reviewed and are negative.   Physical Exam Triage Vital Signs ED Triage Vitals [02/14/21 1700]  Enc Vitals Group     BP (!) 166/89     Pulse Rate 100     Resp 18     Temp 98.3 F (36.8 C)     Temp Source Oral     SpO2 100 %     Weight 210 lb (95.3 kg)     Height 6\' 1"  (1.854 m)     Head Circumference      Peak Flow      Pain Score 1     Pain Loc       Pain Edu?      Excl. in GC?    No data found.  Updated Vital Signs BP (!) 166/89   Pulse 100   Temp 98.3 F (36.8 C) (Oral)   Resp 18   Ht 6\' 1"  (1.854 m)   Wt 210 lb (95.3 kg)   SpO2 100%   BMI 27.71 kg/m   Visual Acuity Right Eye Distance:   Left Eye Distance:   Bilateral Distance:    Right Eye Near:   Left Eye Near:    Bilateral Near:     Physical Exam Skin examination of the right upper extremity demonstrates a raised macular rash consistent with eczema.  Surrounding this area there is a erythematous appearance of the skin without purulent drainage or weeping.  Several raised areas however these are not vesicles.  The area of eczema measures approximately 1.5 inches in diameter.  There is no significant swelling of the forearm or biceps or triceps area.  There is no fluctuance with palpation.  No significant pain on palpation.  Skin examination of the right hand demonstrates a deformity to the right fifth digit with elevation of the fifth knuckle.  There is no significant swelling noted, no erythema or ecchymosis.  No open wound.  He is able make a full composite fist and extend completely without discomfort.  Full strength with extension of the fifth digit.  Full strength with FDS and FDP testing.  The MCP, PIP and DIP joints are all stable to varus and valgus stress testing.  Cap refills intact to each individual digit.  Radial pulses intact to right upper extremity.  The patient does report some discomfort palpation along the dorsal aspect of the MCP joint of the right fifth digit.  Completely nontender palpation throughout the dorsal and volar aspect at both the metacarpal neck, shaft and base.  UC Treatments / Results  Labs (all labs ordered are listed, but only abnormal results are displayed) Labs Reviewed - No data to display  EKG   Radiology DG Hand Complete Right  Result Date: 02/14/2021 CLINICAL DATA:  Right hand pain.  Punched an object 2 weeks ago. EXAM:  RIGHT HAND - COMPLETE 3+ VIEW COMPARISON:  Radiographs 04/17/2013 FINDINGS: The joint spaces are maintained.  No acute fracture is identified. IMPRESSION: No acute bony findings. Electronically Signed   By: 02/16/2021 M.D.   On: 02/14/2021 18:39    Procedures Procedures (  including critical care time)  Medications Ordered in UC Medications - No data to display  Initial Impression / Assessment and Plan / UC Course  I have reviewed the triage vital signs and the nursing notes.  Pertinent labs & imaging results that were available during my care of the patient were reviewed by me and considered in my medical decision making (see chart for details).     1.  Treatment options were discussed today with the patient. 2.  For his right arm eczema the patient was given a refill of his triamcinolone.  The patient was also given a prescription of Keflex to begin taking if he begins to notice any purulent drainage coming from the area of the rash. 3.  Pertain to his right hand when I review the x-rays it appears that the patient has nondisplaced fractures at the base of the third and fourth metacarpals however he has no tenderness with palpation over this area. 4.  He was instructed to purchase a Velcro wrist splint and begin buddy taping the fourth and fifth digit.  Orthopedic provider on call information provided. 5.  Follow-up on as-needed basis at this time. Final Clinical Impressions(s) / UC Diagnoses   Final diagnoses:  Eczema, unspecified type  Nondisplaced fracture of base of third metacarpal bone, right hand, initial encounter for closed fracture     Discharge Instructions      -Use cream as prescribed on affected area to the right arm. -If you begin to notice any purulent drainage coming from the area begin the Keflex that was prescribed for you. -Purchase a over-the-counter volar wrist splint to begin to wear at all times and buddy tape the fourth and fifth digits at this  time. -Follow-up with orthopedic provider information provided.     ED Prescriptions     Medication Sig Dispense Auth. Provider   triamcinolone ointment (KENALOG) 0.5 % Apply 1 application topically 2 (two) times daily. 30 g Anson Oregon, PA-C   cephALEXin (KEFLEX) 500 MG capsule Take 1 capsule (500 mg total) by mouth 4 (four) times daily. 28 capsule Anson Oregon, PA-C      PDMP not reviewed this encounter.   Anson Oregon, [place] 02/14/21 1945

## 2021-02-14 NOTE — ED Triage Notes (Signed)
Pt reports having eczema. Sts he have some redness and irritation to back of R arm. Sts he have not been using eczema cream.
